# Patient Record
Sex: Male | Born: 1935 | Race: White | Hispanic: No | Marital: Married | Smoking: Former smoker
Health system: Southern US, Community
[De-identification: ages and names within clinical notes are randomized; demographics above are authoritative.]

## PROBLEM LIST (undated history)

## (undated) DIAGNOSIS — E785 Hyperlipidemia, unspecified: Secondary | ICD-10-CM

## (undated) DIAGNOSIS — R739 Hyperglycemia, unspecified: Secondary | ICD-10-CM

## (undated) DIAGNOSIS — I219 Acute myocardial infarction, unspecified: Secondary | ICD-10-CM

## (undated) DIAGNOSIS — I509 Heart failure, unspecified: Secondary | ICD-10-CM

## (undated) DIAGNOSIS — K219 Gastro-esophageal reflux disease without esophagitis: Secondary | ICD-10-CM

## (undated) DIAGNOSIS — I10 Essential (primary) hypertension: Secondary | ICD-10-CM

## (undated) DIAGNOSIS — K635 Polyp of colon: Secondary | ICD-10-CM

## (undated) DIAGNOSIS — G629 Polyneuropathy, unspecified: Secondary | ICD-10-CM

## (undated) DIAGNOSIS — M1990 Unspecified osteoarthritis, unspecified site: Secondary | ICD-10-CM

## (undated) DIAGNOSIS — D699 Hemorrhagic condition, unspecified: Secondary | ICD-10-CM

## (undated) DIAGNOSIS — E213 Hyperparathyroidism, unspecified: Secondary | ICD-10-CM

## (undated) DIAGNOSIS — I4891 Unspecified atrial fibrillation: Secondary | ICD-10-CM

## (undated) DIAGNOSIS — I2510 Atherosclerotic heart disease of native coronary artery without angina pectoris: Secondary | ICD-10-CM

## (undated) DIAGNOSIS — I739 Peripheral vascular disease, unspecified: Secondary | ICD-10-CM

## (undated) DIAGNOSIS — J189 Pneumonia, unspecified organism: Secondary | ICD-10-CM

## (undated) DIAGNOSIS — I519 Heart disease, unspecified: Secondary | ICD-10-CM

## (undated) DIAGNOSIS — J449 Chronic obstructive pulmonary disease, unspecified: Secondary | ICD-10-CM

## (undated) DIAGNOSIS — I499 Cardiac arrhythmia, unspecified: Secondary | ICD-10-CM

## (undated) DIAGNOSIS — J45909 Unspecified asthma, uncomplicated: Secondary | ICD-10-CM

## (undated) DIAGNOSIS — R972 Elevated prostate specific antigen [PSA]: Secondary | ICD-10-CM

## (undated) DIAGNOSIS — R011 Cardiac murmur, unspecified: Secondary | ICD-10-CM

## (undated) HISTORY — DX: Acute myocardial infarction, unspecified: I21.9

## (undated) HISTORY — DX: Hemorrhagic condition, unspecified: D69.9

## (undated) HISTORY — DX: Cardiac murmur, unspecified: R01.1

## (undated) HISTORY — DX: Unspecified osteoarthritis, unspecified site: M19.90

## (undated) HISTORY — PX: CARDIAC SURGERY: SHX584

## (undated) HISTORY — PX: TRANSURETHRAL RESECTION OF PROSTATE: SHX73

## (undated) HISTORY — DX: Unspecified atrial fibrillation: I48.91

## (undated) HISTORY — PX: HERNIA REPAIR: SHX51

## (undated) HISTORY — DX: Gastro-esophageal reflux disease without esophagitis: K21.9

## (undated) HISTORY — DX: Heart disease, unspecified: I51.9

## (undated) HISTORY — DX: Heart failure, unspecified: I50.9

## (undated) HISTORY — PX: PARATHYROIDECTOMY: SHX19

## (undated) HISTORY — PX: CARDIAC VALVE SURGERY: SHX40

---

## 2005-06-09 ENCOUNTER — Ambulatory Visit: Payer: Self-pay | Admitting: Gastroenterology

## 2007-02-14 ENCOUNTER — Ambulatory Visit: Payer: Self-pay | Admitting: Urology

## 2007-02-14 IMAGING — CT CT ABDOMEN AND PELVIS WITHOUT AND WITH CONTRAST
2 of 4 series · 14 of 32 positions shown, 19 images · IV contrast (agent unspecified)
Comparison: none

REASON FOR EXAM: hematuria
COMMENTS:

PROCEDURE:     CT  - CT ABDOMEN / PELVIS  W/WO  - [DATE]  [DATE]
RESULT:
TECHNIQUQE: Axial images were obtained from the hemidiaphragm to the pubic
symphysis pre and post intravenous injection of contrast material.
Nephrographic and excretory phases are obtained post contrast injection.

[Series 2: soft tissue w/o · axial · non-contrast · 0.78mm/px · z∈[-446,-54]mm · 8 of 63 slices shown, 13 images]
[im 7/63  soft-tissue]
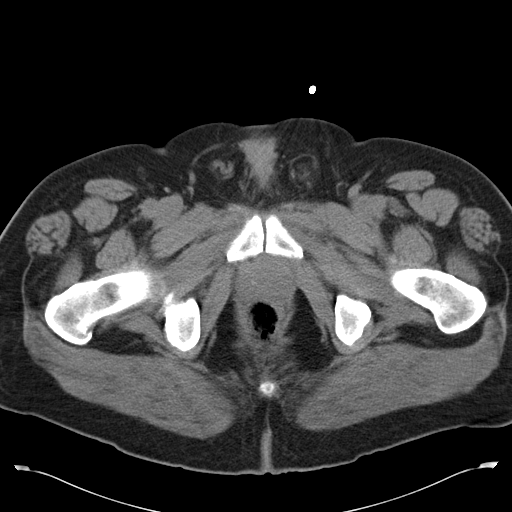
[im 7/63  bone]
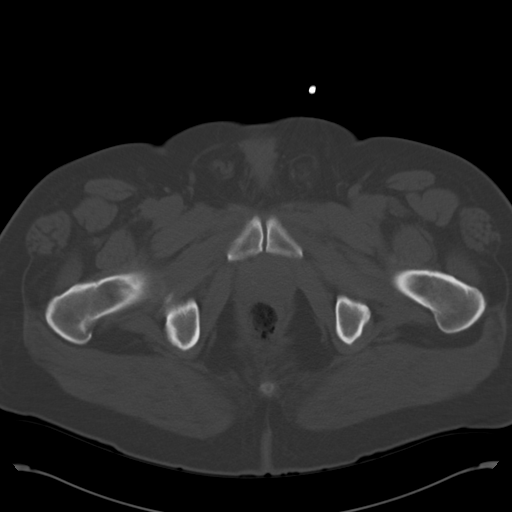
[im 14/63  soft-tissue]
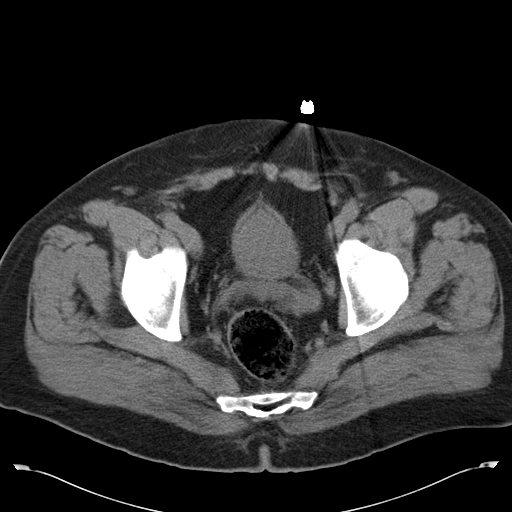
[im 21/63  soft-tissue]
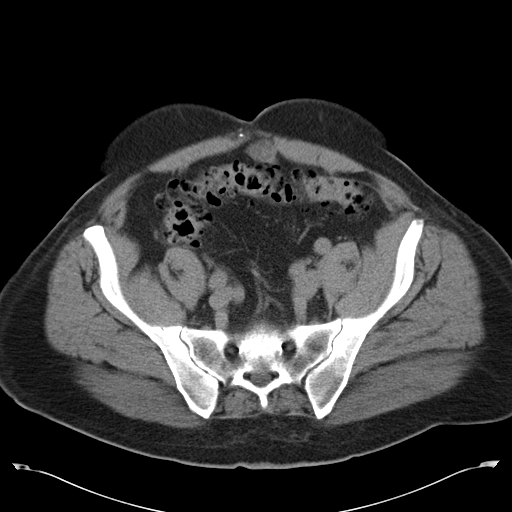
[im 28/63  soft-tissue]
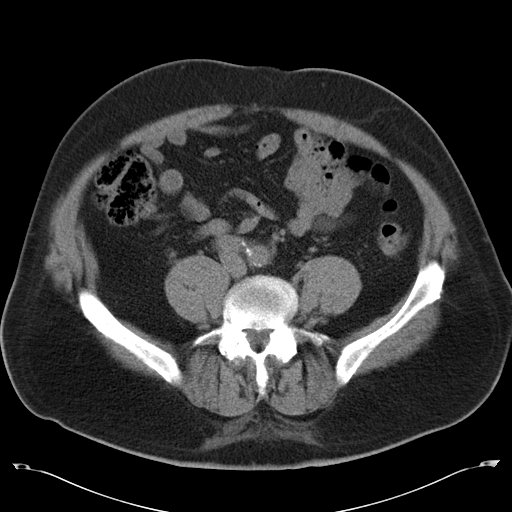
[im 35/63  soft-tissue]
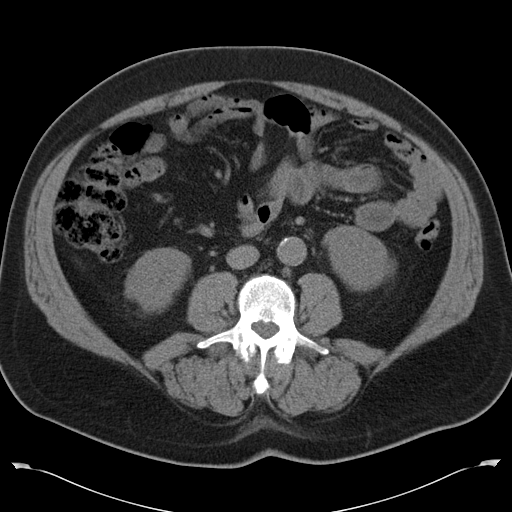
[im 35/63  lung]
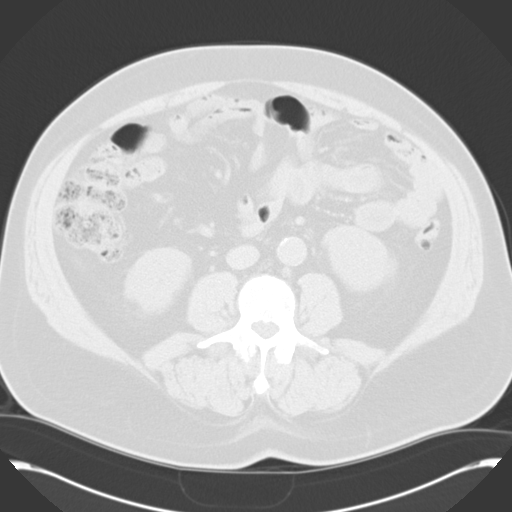
[im 42/63  soft-tissue]
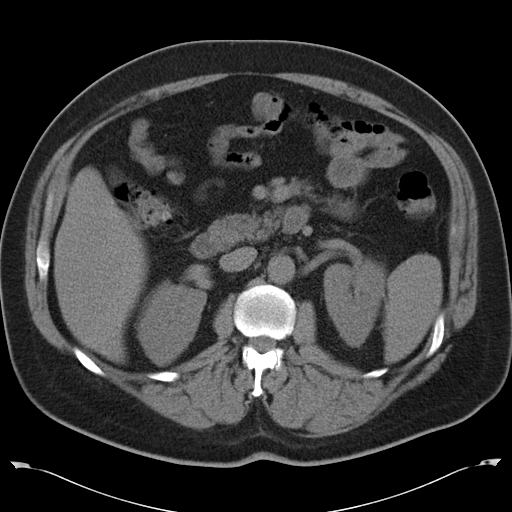
[im 42/63  lung]
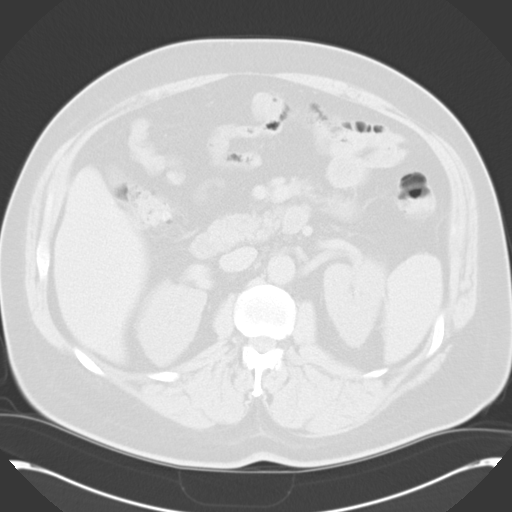
[im 49/63  soft-tissue]
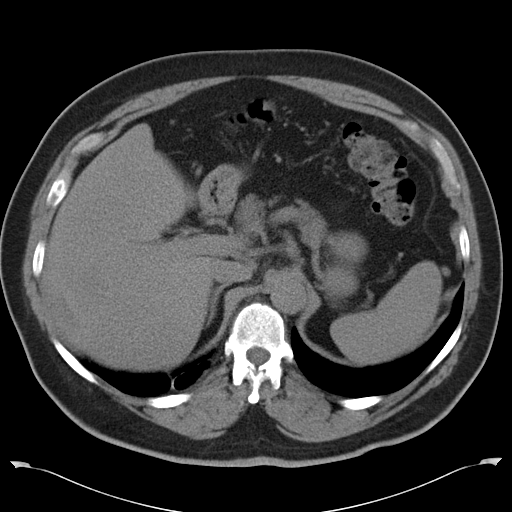
[im 49/63  lung]
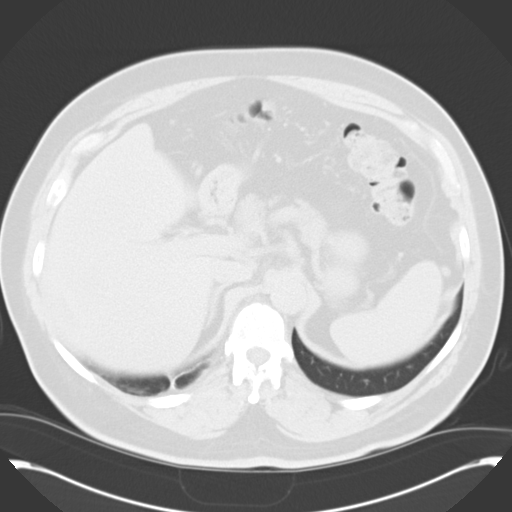
[im 56/63  soft-tissue]
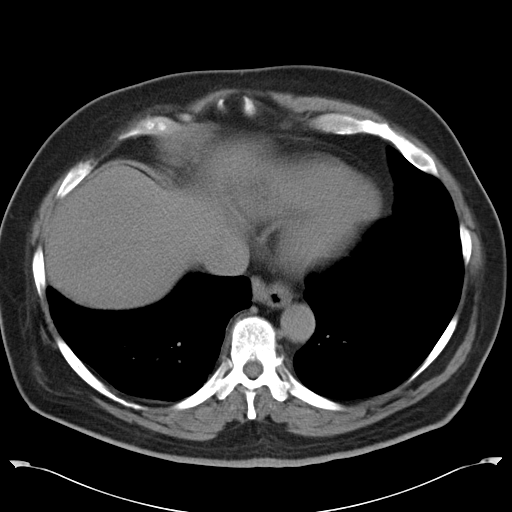
[im 56/63  lung]
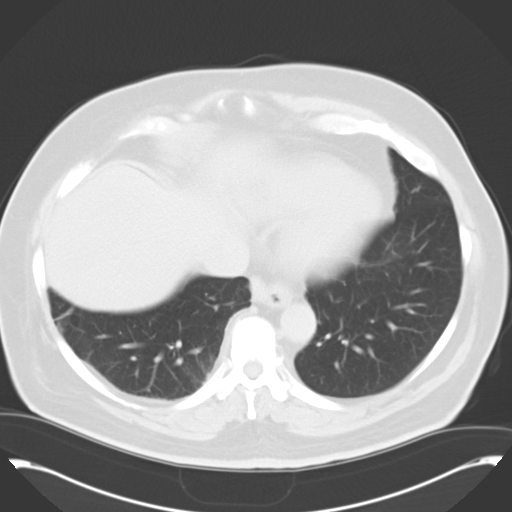

[Series 4: soft tissue with · axial · 0.78mm/px · z∈[-438,-126]mm · 6 of 63 slices shown]
[im 8/63  soft-tissue]
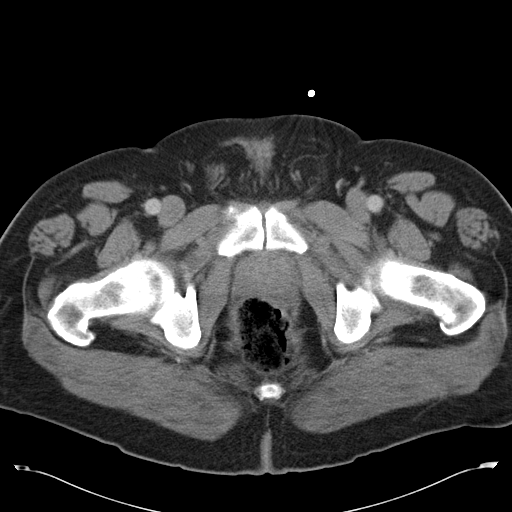
[im 16/63  soft-tissue]
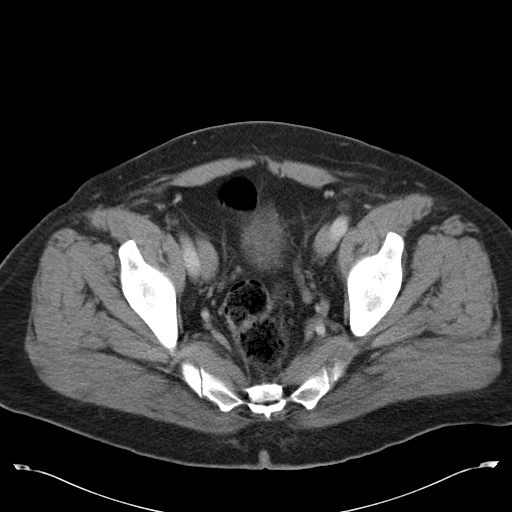
[im 24/63  soft-tissue]
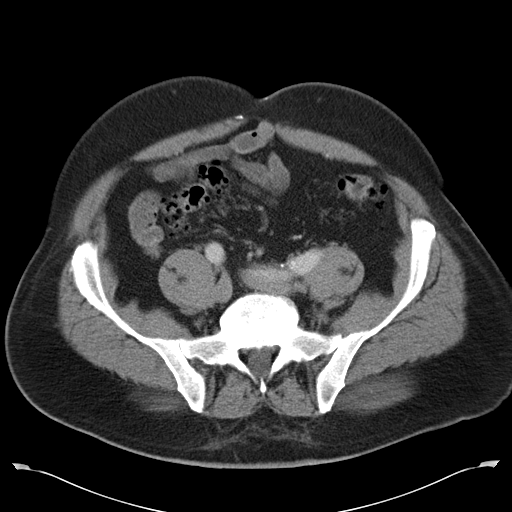
[im 32/63  soft-tissue]
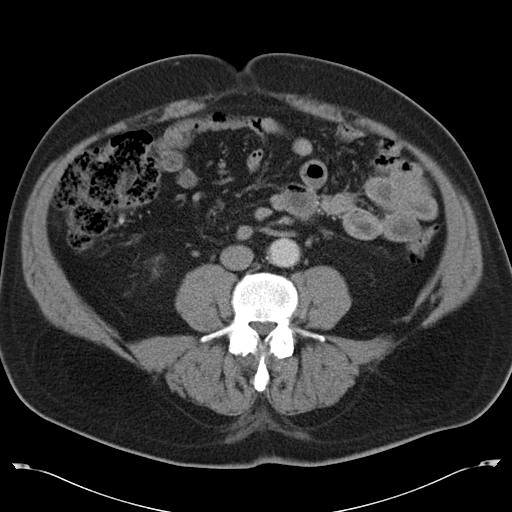
[im 39/63  soft-tissue]
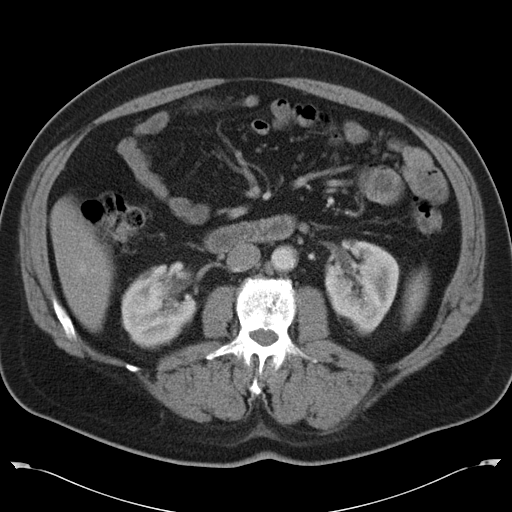
[im 47/63  soft-tissue]
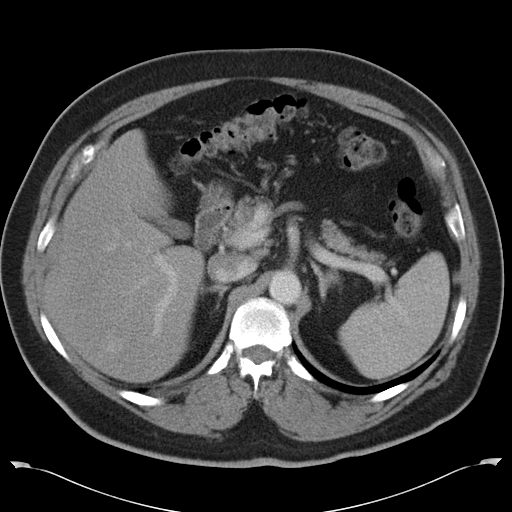

[14 of 32 positions shown; findings below may reference images not displayed]

FINDINGS: On the unenhanced images a tiny 1 mm calculus is noted in the
lower pole of the LEFT kidney on image #25. No obvious ureteral calculi are
seen. Post intravenous injection of contrast material both kidneys are noted
to excrete the contrast. No obvious mass is noted in the urinary bladder.
There is some impression on the floor of the urinary bladder more on the
LEFT than the RIGHT presumably from prostatic hypertrophy. There is noted
sigmoid diverticulosis without diverticulitis.

No major organ abnormality is seen. No obvious appendicitis is noted. No
effusions. The lung bases appear clear.
IMPRESSION: 1)1     mm calculus in the lower pole of the LEFT kidney which is
non-obstructing. No ureteral calculi. There is impression on the floor of
the urinary bladder asymmetrically on the LEFT, which may be secondary to
prostatic hypertrophy.

2)Sigmoid diverticulosis without diverticulitis.

## 2009-09-07 HISTORY — PX: AORTIC VALVE REPLACEMENT (AVR)/CORONARY ARTERY BYPASS GRAFTING (CABG): SHX5725

## 2009-12-10 ENCOUNTER — Ambulatory Visit: Payer: Self-pay | Admitting: Urology

## 2009-12-15 ENCOUNTER — Inpatient Hospital Stay: Payer: Self-pay | Admitting: Internal Medicine

## 2009-12-15 IMAGING — CR DG CHEST 1V PORT
1 series · 1 of 1 positions shown · non-contrast
Comparison: none

REASON FOR EXAM: cp
COMMENTS:

PROCEDURE:     DXR - DXR PORTABLE CHEST SINGLE VIEW  - [DATE]  [DATE]
RESULT:     The lungs are adequately inflated. The interstitial markings are
increased. The cardiac silhouette is normal in size. The pulmonary
vascularity is indistinct.

[view not recorded]
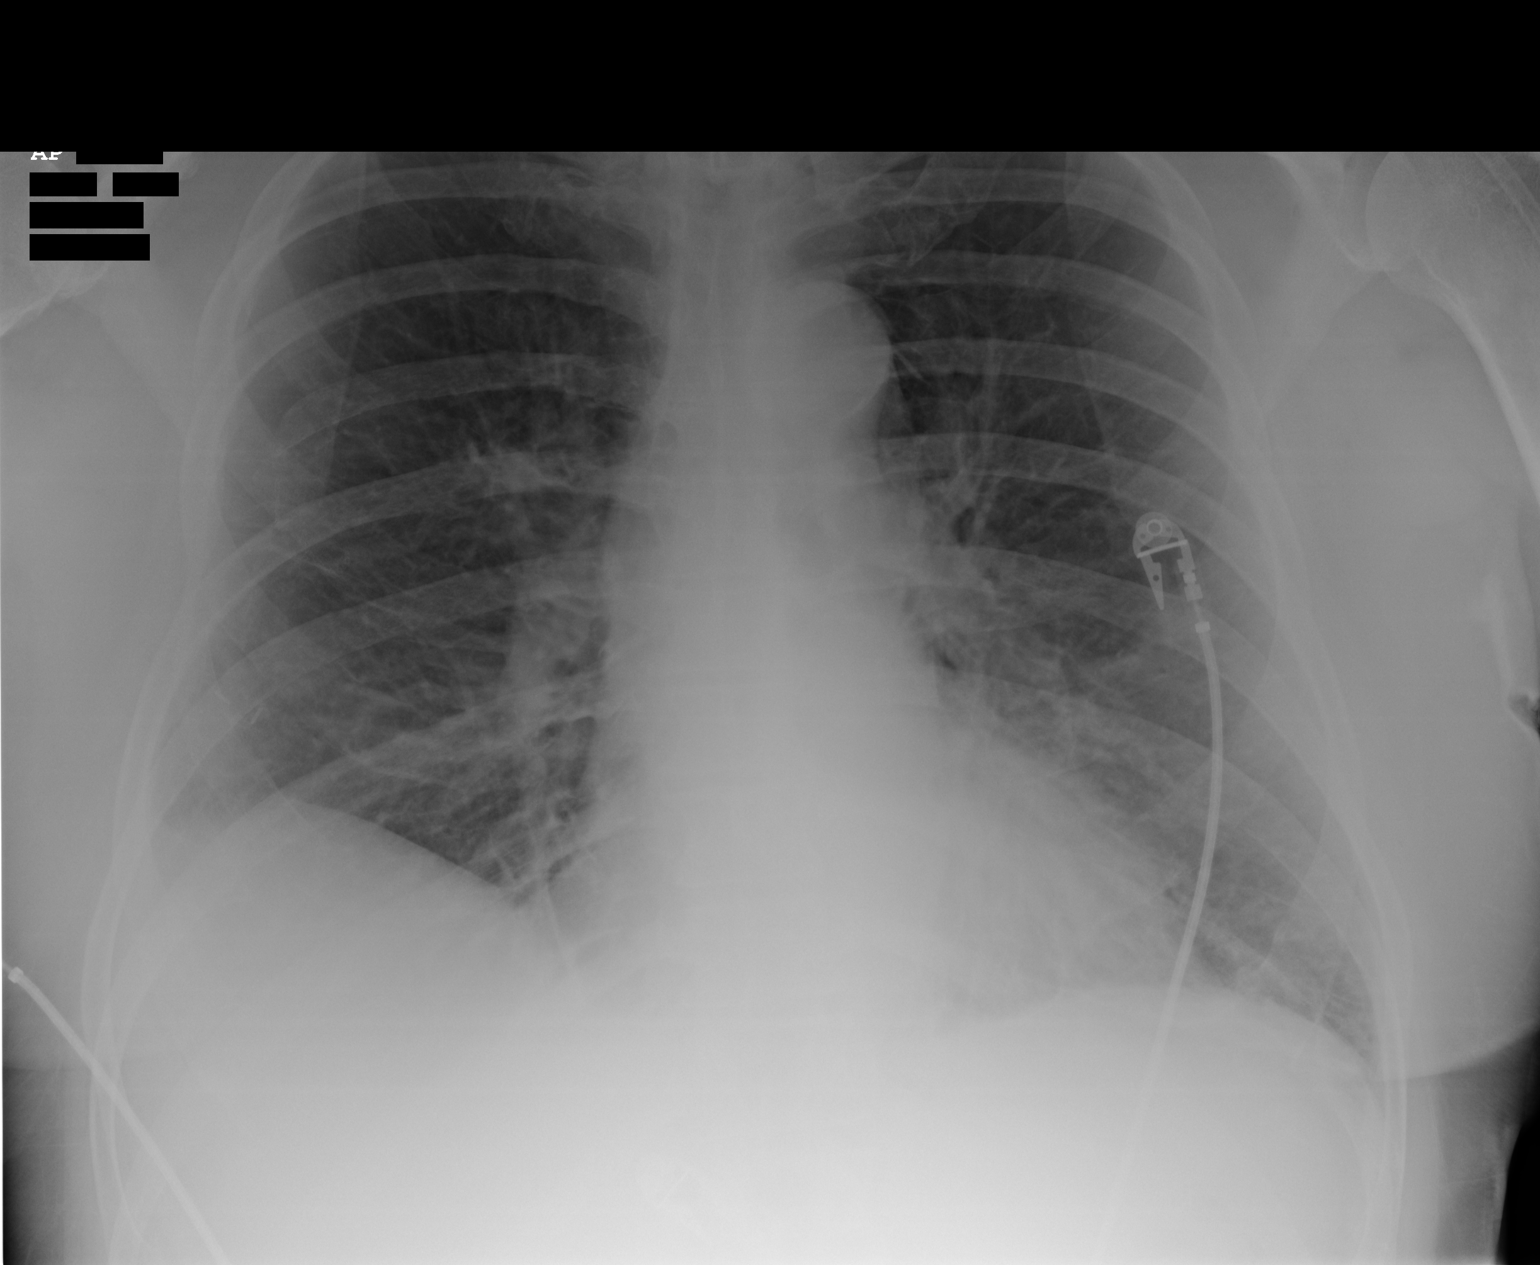

[1 of 1 positions shown; findings below may reference images not displayed]

IMPRESSION: The findings suggest low-grade interstitial edema possibly
secondary to CHF. There is no focal pneumonia. A followup PA and lateral
chest x-ray would be of value.

## 2009-12-18 ENCOUNTER — Ambulatory Visit: Payer: Self-pay | Admitting: Cardiology

## 2010-01-27 ENCOUNTER — Encounter: Payer: Self-pay | Admitting: Internal Medicine

## 2010-01-31 ENCOUNTER — Ambulatory Visit: Payer: Self-pay | Admitting: Internal Medicine

## 2010-02-06 ENCOUNTER — Encounter: Payer: Self-pay | Admitting: Internal Medicine

## 2010-02-13 ENCOUNTER — Inpatient Hospital Stay: Payer: Self-pay | Admitting: Specialist

## 2010-02-13 IMAGING — CR DG CHEST 1V PORT
1 series · 1 of 1 positions shown · non-contrast
Comparison: none

REASON FOR EXAM: CP
COMMENTS:

[view not recorded]
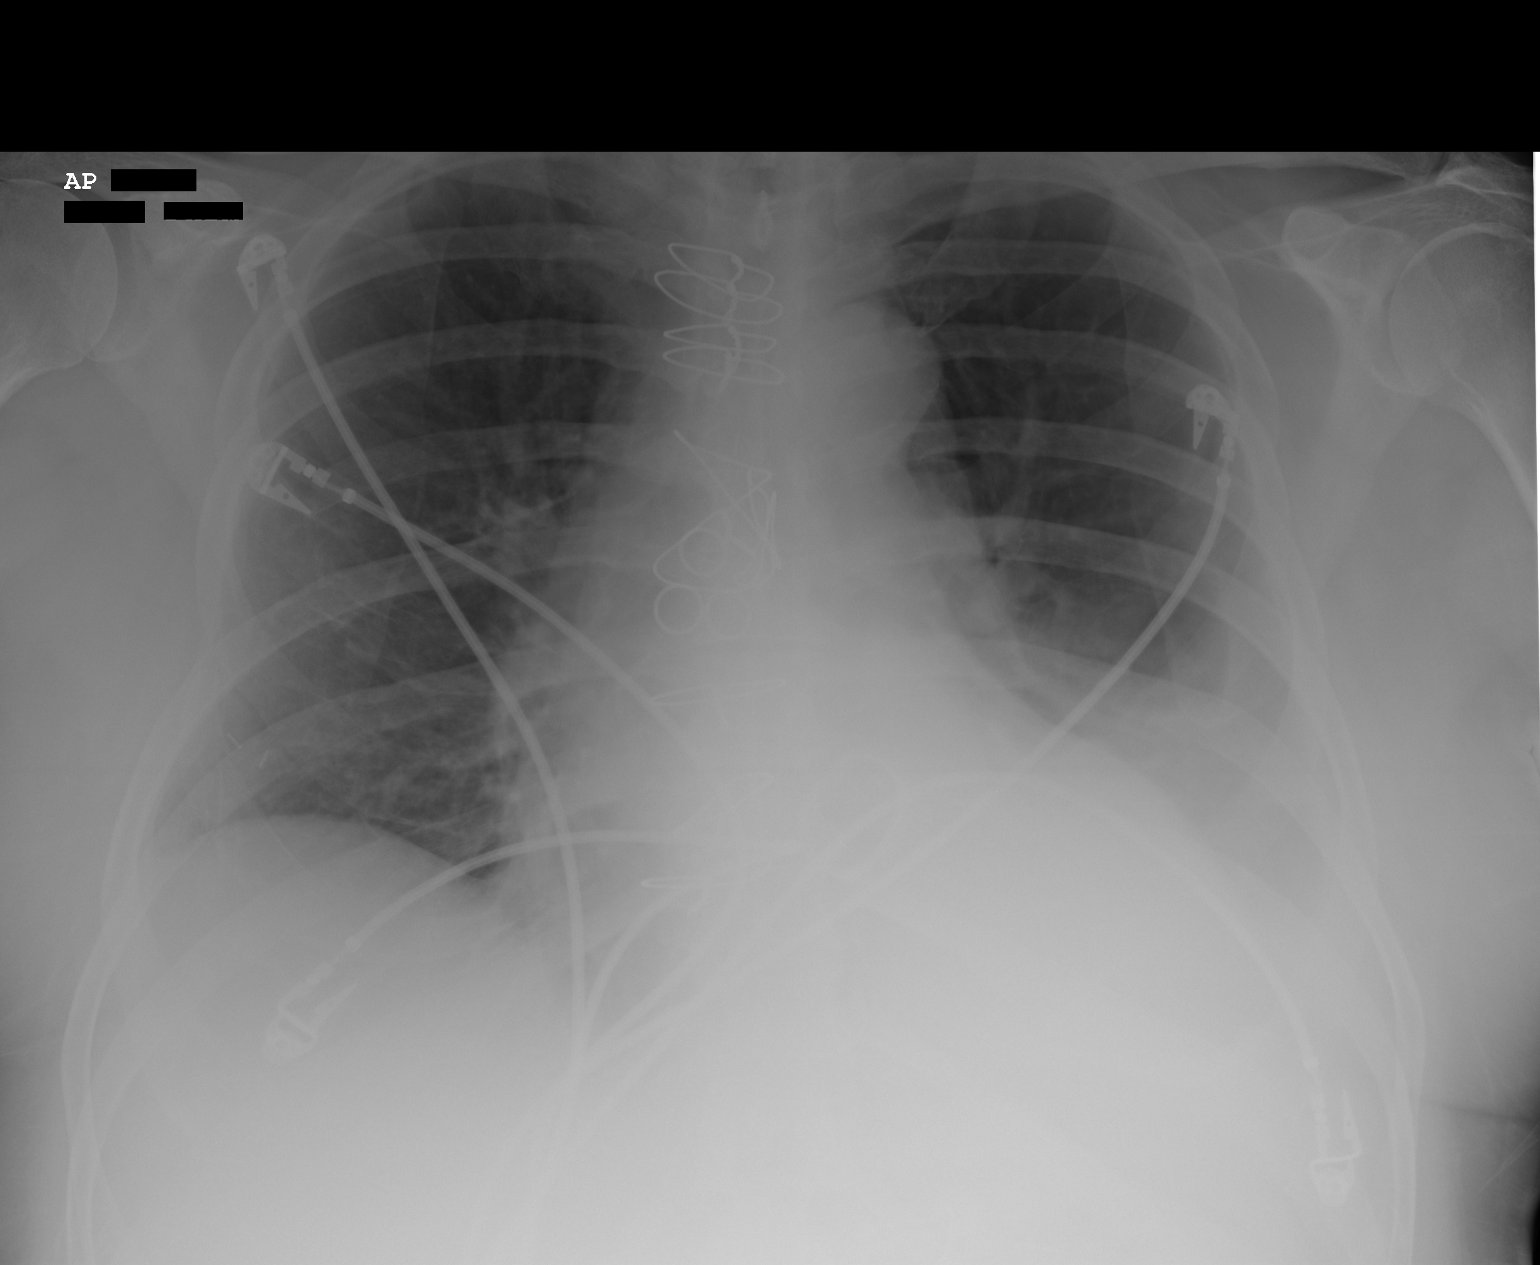

[1 of 1 positions shown; findings below may reference images not displayed]

PROCEDURE:     DXR - DXR PORTABLE CHEST SINGLE VIEW  - [DATE]  [DATE]

RESULT:     Comparison is made to the study [DATE].

The left hemidiaphragm is obscured. The cardiac silhouette is mildly
enlarged. The pulmonary vascularity is mildly prominent centrally. There is
no pleural effusion on the right than on the left I suspect that there is
pleural fluid.
IMPRESSION: The findings likely reflect low-grade CHF. A small left
pleural effusion is suspected. Followup PA and lateral chest films would be
of value.

## 2010-03-07 ENCOUNTER — Encounter: Payer: Self-pay | Admitting: Internal Medicine

## 2010-03-20 ENCOUNTER — Encounter: Payer: Self-pay | Admitting: Internal Medicine

## 2010-04-07 ENCOUNTER — Encounter: Payer: Self-pay | Admitting: Internal Medicine

## 2010-05-05 ENCOUNTER — Ambulatory Visit: Payer: Self-pay | Admitting: Internal Medicine

## 2010-05-08 ENCOUNTER — Encounter: Payer: Self-pay | Admitting: Internal Medicine

## 2010-05-27 ENCOUNTER — Observation Stay: Payer: Self-pay | Admitting: Specialist

## 2010-05-27 IMAGING — CR DG CHEST 1V PORT
1 series · 1 of 1 positions shown · non-contrast
Comparison: none

REASON FOR EXAM: chest pain
COMMENTS:

[view not recorded]
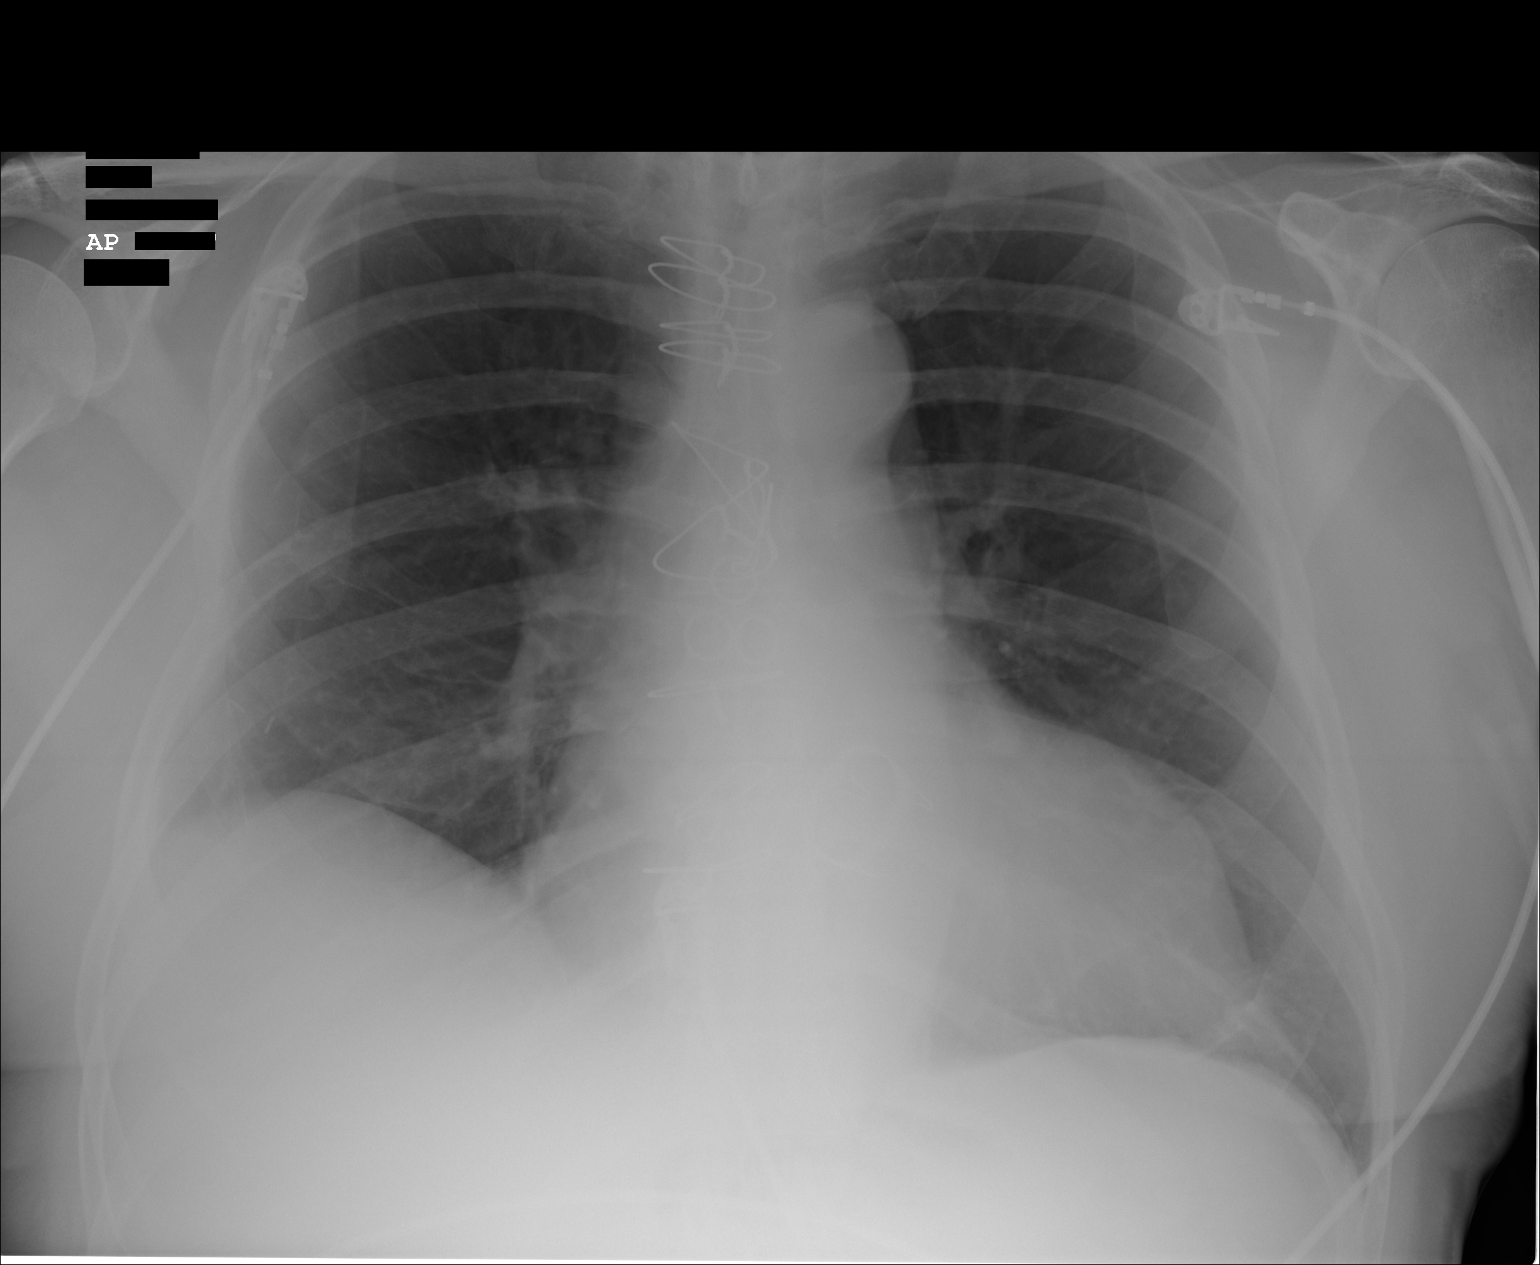

[1 of 1 positions shown; findings below may reference images not displayed]

PROCEDURE:     DXR - DXR PORTABLE CHEST SINGLE VIEW  - [DATE]  [DATE]

RESULT:     Comparison is made to a study dated [DATE].

The right hemidiaphragm is mildly elevated. The left lung base has cleared.
The cardiac silhouette is enlarged. The patient has undergone prior CABG.
The pulmonary vascularity is not engorged. The interstitial markings are
minimally prominent.
IMPRESSION: I do not see evidence of CHF nor of pneumonia. The right
hemidiaphragm is mildly elevated which is not a new finding.

## 2010-06-07 ENCOUNTER — Encounter: Payer: Self-pay | Admitting: Internal Medicine

## 2010-07-08 ENCOUNTER — Encounter: Payer: Self-pay | Admitting: Internal Medicine

## 2010-08-07 ENCOUNTER — Encounter: Payer: Self-pay | Admitting: Internal Medicine

## 2010-09-07 ENCOUNTER — Encounter: Payer: Self-pay | Admitting: Internal Medicine

## 2010-10-08 ENCOUNTER — Encounter: Payer: Self-pay | Admitting: Internal Medicine

## 2012-07-05 DIAGNOSIS — R3129 Other microscopic hematuria: Secondary | ICD-10-CM | POA: Insufficient documentation

## 2012-07-05 DIAGNOSIS — N401 Enlarged prostate with lower urinary tract symptoms: Secondary | ICD-10-CM | POA: Insufficient documentation

## 2012-07-05 DIAGNOSIS — R972 Elevated prostate specific antigen [PSA]: Secondary | ICD-10-CM | POA: Insufficient documentation

## 2014-06-27 DIAGNOSIS — I5022 Chronic systolic (congestive) heart failure: Secondary | ICD-10-CM | POA: Insufficient documentation

## 2014-06-27 DIAGNOSIS — E782 Mixed hyperlipidemia: Secondary | ICD-10-CM | POA: Insufficient documentation

## 2014-06-27 DIAGNOSIS — I340 Nonrheumatic mitral (valve) insufficiency: Secondary | ICD-10-CM | POA: Insufficient documentation

## 2014-06-27 DIAGNOSIS — I6523 Occlusion and stenosis of bilateral carotid arteries: Secondary | ICD-10-CM | POA: Insufficient documentation

## 2014-06-27 DIAGNOSIS — I480 Paroxysmal atrial fibrillation: Secondary | ICD-10-CM | POA: Insufficient documentation

## 2014-06-27 DIAGNOSIS — I10 Essential (primary) hypertension: Secondary | ICD-10-CM | POA: Insufficient documentation

## 2014-10-17 DIAGNOSIS — I2510 Atherosclerotic heart disease of native coronary artery without angina pectoris: Secondary | ICD-10-CM | POA: Insufficient documentation

## 2016-12-02 DIAGNOSIS — I4892 Unspecified atrial flutter: Secondary | ICD-10-CM | POA: Insufficient documentation

## 2017-10-13 DIAGNOSIS — R0602 Shortness of breath: Secondary | ICD-10-CM | POA: Insufficient documentation

## 2017-12-02 DIAGNOSIS — R42 Dizziness and giddiness: Secondary | ICD-10-CM | POA: Insufficient documentation

## 2018-07-29 ENCOUNTER — Other Ambulatory Visit: Payer: Self-pay

## 2018-07-29 ENCOUNTER — Encounter: Payer: Self-pay | Admitting: Urology

## 2018-07-29 ENCOUNTER — Ambulatory Visit: Payer: Medicare Other | Admitting: Urology

## 2018-07-29 VITALS — BP 104/74 | HR 57 | Ht 73.0 in | Wt 215.6 lb

## 2018-07-29 DIAGNOSIS — N401 Enlarged prostate with lower urinary tract symptoms: Secondary | ICD-10-CM | POA: Diagnosis not present

## 2018-07-29 DIAGNOSIS — R972 Elevated prostate specific antigen [PSA]: Secondary | ICD-10-CM | POA: Diagnosis not present

## 2018-07-29 DIAGNOSIS — R310 Gross hematuria: Secondary | ICD-10-CM | POA: Diagnosis not present

## 2018-07-29 DIAGNOSIS — R3911 Hesitancy of micturition: Secondary | ICD-10-CM

## 2018-07-31 ENCOUNTER — Encounter: Payer: Self-pay | Admitting: Urology

## 2018-07-31 NOTE — Progress Notes (Signed)
07/29/2018 8:56 AM   Dwayne Robinson Jul 20, 1936 [id number]  Referring provider: Jaclyn Shaggy, MD [address]   [place], [place] [address]  Chief Complaint  Patient presents with  . Hematuria    HPI: 82 year old male seen at the request of Dr. Arlana Pouch for evaluation of gross hematuria.  Approximately 2 months ago he had an episode of total gross painless hematuria.  He states this was present for 1 void and cleared.  He denies use of anticoagulant medication.  He had a urinalysis at Dr. Audley Hose on 06/25/2018 which showed 10-15 RBCs.  He has a long history of BPH.  He was previously on finasteride but stopped this medication in 2016.  He has moderate lower urinary tract symptoms including urinary hesitancy and a prolonged voiding time however states his symptoms are not bothersome.  He has a history of an elevated PSA and underwent a prostate biopsy in 2000 for PSA of 5.4.  He also underwent a hematuria evaluation greater than 10 years ago which showed BPH.   PMH: Past Medical History:  Diagnosis Date  . A-fib (HCC)   . Arthritis   . Bleeding disorder (HCC)   . GERD (gastroesophageal reflux disease)   . Heart attack (HCC)   . Heart disease   . Heart failure (HCC)   . Heart murmur     Surgical History: Past Surgical History:  Procedure Laterality Date  . CARDIAC SURGERY    . CARDIAC VALVE SURGERY      Home Medications:  Allergies as of 07/29/2018      Reactions   Sulfa Antibiotics Hives      Medication List        Accurate as of 07/29/18 11:59 PM. Always use your most recent med list.          DULoxetine 60 MG capsule Commonly known as:  CYMBALTA Take by mouth.   metoprolol succinate 50 MG 24 hr tablet Commonly known as:  TOPROL-XL Take by mouth.   omeprazole 20 MG capsule Commonly known as:  PRILOSEC Take by mouth.   pravastatin 40 MG tablet Commonly known as:  PRAVACHOL Take by mouth.   spironolactone 25 MG tablet Commonly known as:   ALDACTONE Take by mouth.       Allergies:  Allergies  Allergen Reactions  . Sulfa Antibiotics Hives    Family History: Family History  Problem Relation Age of Onset  . Stroke Mother   . Heart Problems Father   . Prostate cancer Neg Hx   . Bladder Cancer Neg Hx   . Kidney cancer Neg Hx     Social History:  reports that he quit smoking about 30 years ago. His smoking use included cigarettes. He has never used smokeless tobacco. He reports that he drank alcohol. He reports that he does not use drugs.  ROS: UROLOGY Frequent Urination?: Yes Hard to postpone urination?: Yes Burning/pain with urination?: Yes Get up at night to urinate?: Yes Leakage of urine?: Yes Urine stream starts and stops?: Yes Trouble starting stream?: No Do you have to strain to urinate?: No Blood in urine?: Yes Urinary tract infection?: No Sexually transmitted disease?: No Injury to kidneys or bladder?: No Painful intercourse?: No Weak stream?: Yes Erection problems?: No Penile pain?: No  Gastrointestinal Nausea?: No Vomiting?: No Indigestion/heartburn?: Yes Diarrhea?: No Constipation?: Yes  Constitutional Fever: No Night sweats?: No Weight loss?: No Fatigue?: Yes  Skin Skin rash/lesions?: Yes Itching?: Yes  Eyes Blurred vision?: Yes Double  vision?: No  Ears/Nose/Throat Sore throat?: No Sinus problems?: No  Hematologic/Lymphatic Swollen glands?: No Easy bruising?: Yes  Cardiovascular Leg swelling?: No Chest pain?: No  Respiratory Cough?: No Shortness of breath?: Yes  Endocrine Excessive thirst?: No  Musculoskeletal Back pain?: Yes Joint pain?: Yes  Neurological Headaches?: No Dizziness?: Yes  Psychologic Depression?: No Anxiety?: No  Physical Exam: BP 104/74   Pulse (!) 57   Ht 6\' 1"  (1.854 m)   Wt 215 lb 9.6 oz (97.8 kg)   BMI 28.44 kg/m   Constitutional:  Alert and oriented, No acute distress. HEENT: [place] AT, moist mucus membranes.  Trachea  midline, no masses. Cardiovascular: No clubbing, cyanosis, or edema. Respiratory: Normal respiratory effort, no increased work of breathing. GI: Abdomen is soft, nontender, nondistended, no abdominal masses GU: No CVA tenderness.  Prostate 50 g, smooth without nodules Lymph: No cervical or inguinal lymphadenopathy. Skin: No rashes, bruises or suspicious lesions. Neurologic: Grossly intact, no focal deficits, moving all 4 extremities. Psychiatric: Normal mood and affect.  Assessment & Plan:   82 year old male with an isolated episode of total gross painless hematuria without recurrence.  I recommended a repeat hematuria evaluation to include CT urogram and cystoscopy.  He had a negative prostate biopsy in 2000 for a mildly elevated PSA and his PSA remains stable.  It was last checked in 2015.  He does not desire to have checked and would not recommend continued monitoring based on his age.   Riki AltesScott C Draden Cottingham, MD  [facility] [address], [address] [place], [place] [address] [phone]

## 2018-08-08 DIAGNOSIS — I119 Hypertensive heart disease without heart failure: Secondary | ICD-10-CM | POA: Insufficient documentation

## 2018-09-21 ENCOUNTER — Ambulatory Visit
Admission: RE | Admit: 2018-09-21 | Discharge: 2018-09-21 | Disposition: A | Discharge status: Home / Self Care | Payer: Medicare Other | Source: Ambulatory Visit | Attending: Urology | Admitting: Urology

## 2018-09-21 DIAGNOSIS — R310 Gross hematuria: Secondary | ICD-10-CM | POA: Insufficient documentation

## 2018-09-21 LAB — POCT I-STAT CREATININE: Creatinine, Ser: 1.1 mg/dL (ref 0.61–1.24)

## 2018-09-21 IMAGING — CT CT ABD-PEL WO/W CM
2 of 12 series · 8 of 46 positions shown, 14 images · IV contrast (iopamidol)
Comparison: CT the abdomen and pelvis [DATE].

CLINICAL DATA: 82-year-old male with history of intermittent
painless gross hematuria and frequent urination with nocturia.
History of ruptured bowel status post surgical repair as a child.

EXAM:
CT ABDOMEN AND PELVIS WITHOUT AND WITH CONTRAST
TECHNIQUE: Multidetector CT imaging of the abdomen and pelvis was performed
following the standard protocol before and following the bolus
administration of intravenous contrast.
CONTRAST:  125mL [EK] IOPAMIDOL ([EK]) INJECTION 61%

[Series 5: cor without without pre · coronal · non-contrast · 0.76mm/px · 2 of 149 slices shown, 3 images]
[im 50/149  soft-tissue]
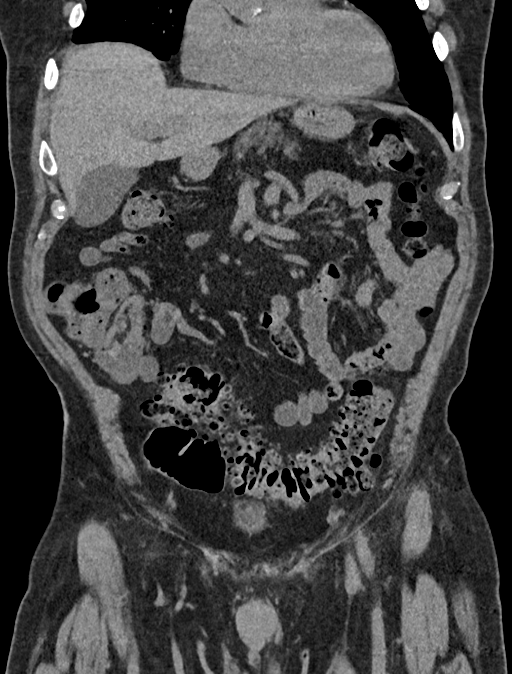
[im 50/149  bone]
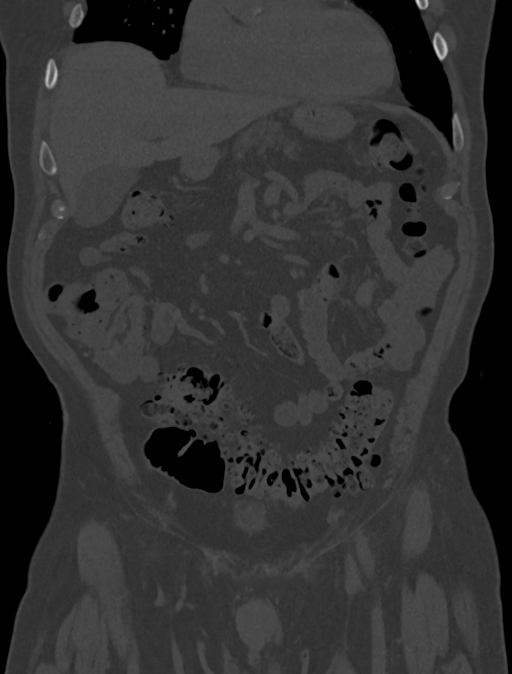
[im 99/149  soft-tissue]
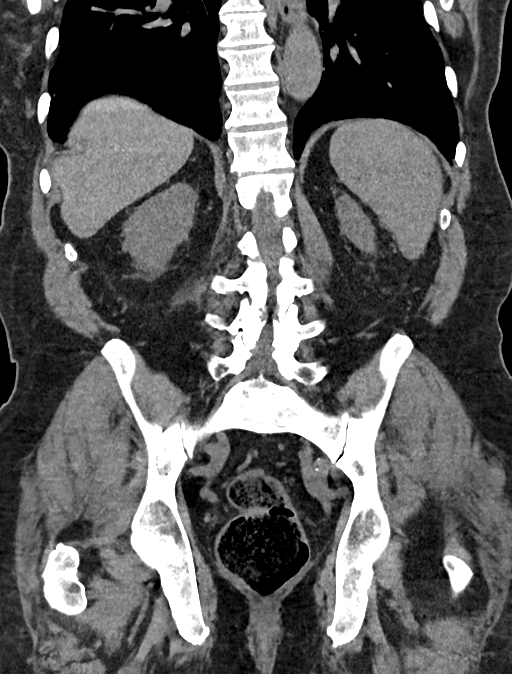

[Series 9: axial with hematuria with · axial · 0.76mm/px · z∈[-1550,-1190]mm · 6 of 102 slices shown, 11 images]
[im 15/102  soft-tissue]
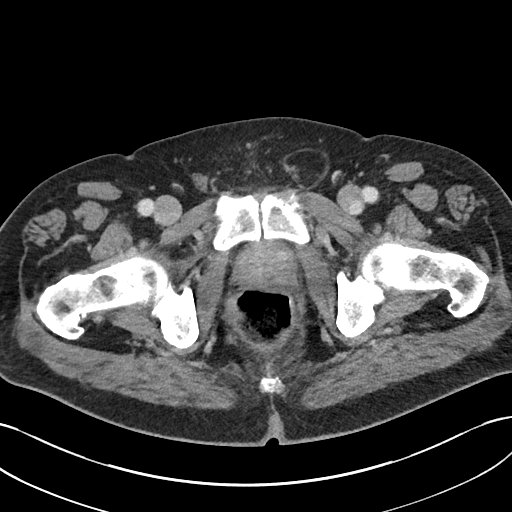
[im 15/102  bone]
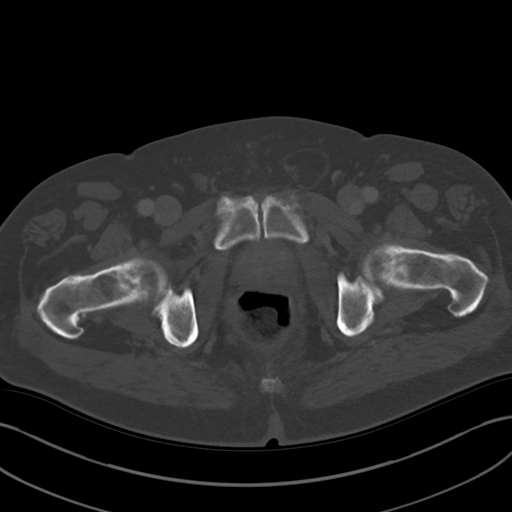
[im 29/102  soft-tissue]
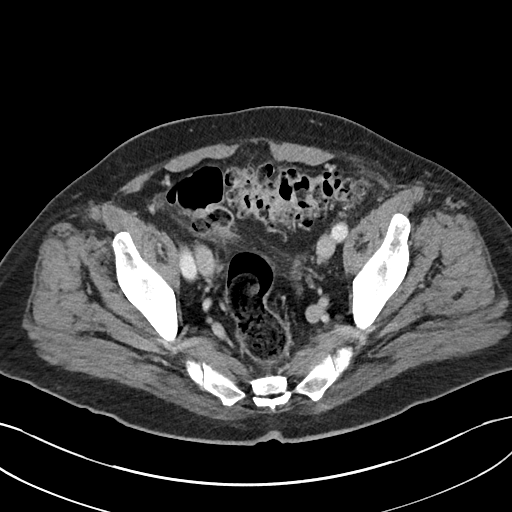
[im 44/102  soft-tissue]
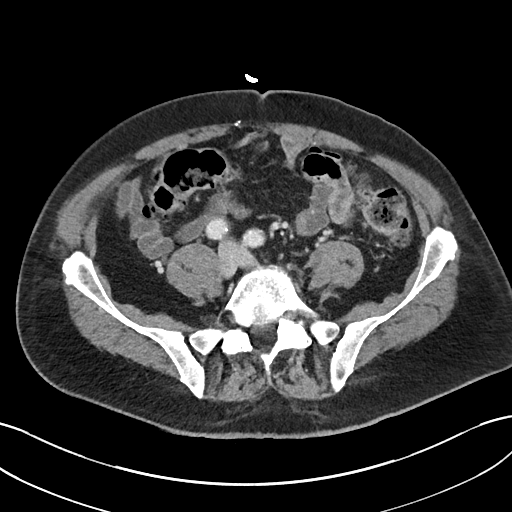
[im 44/102  lung]
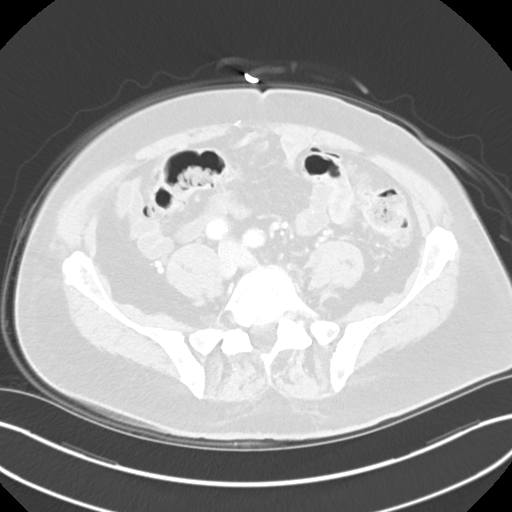
[im 58/102  soft-tissue]
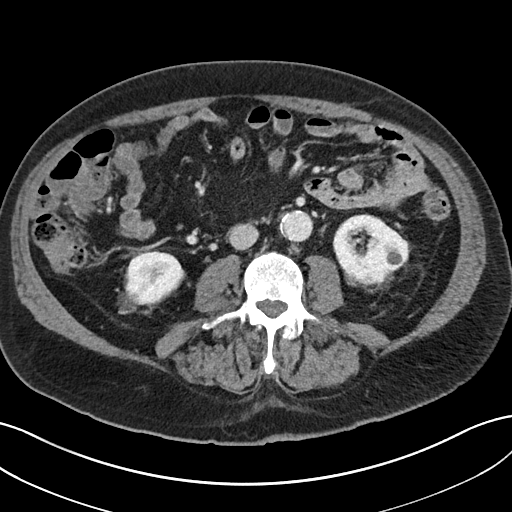
[im 58/102  lung]
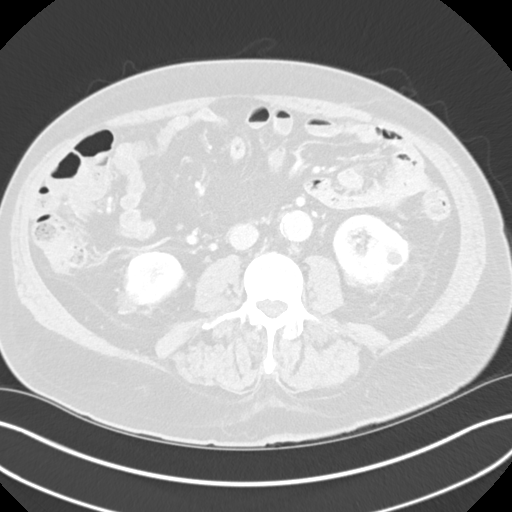
[im 73/102  soft-tissue]
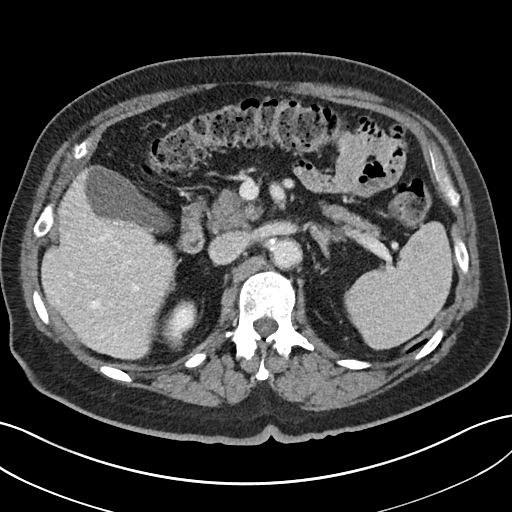
[im 73/102  lung]
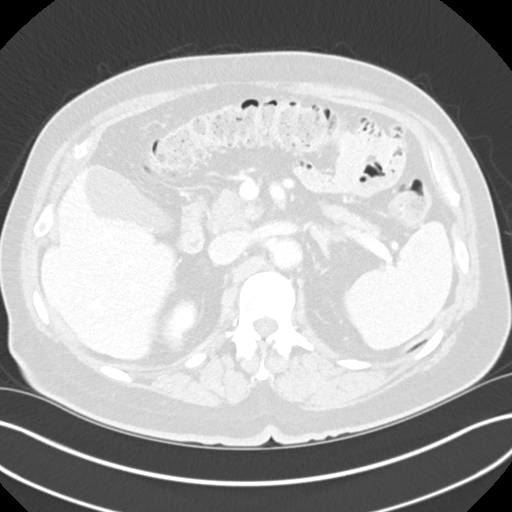
[im 87/102  soft-tissue]
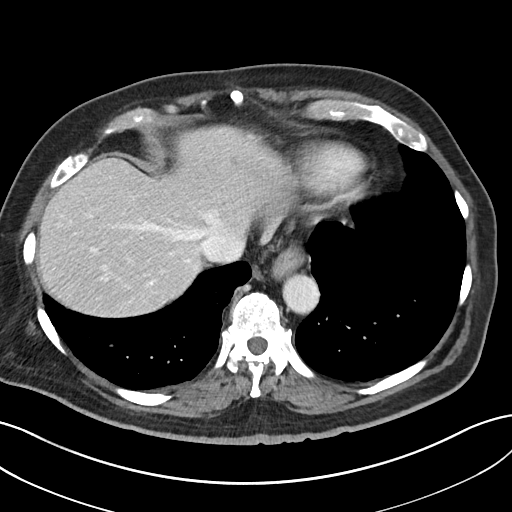
[im 87/102  lung]
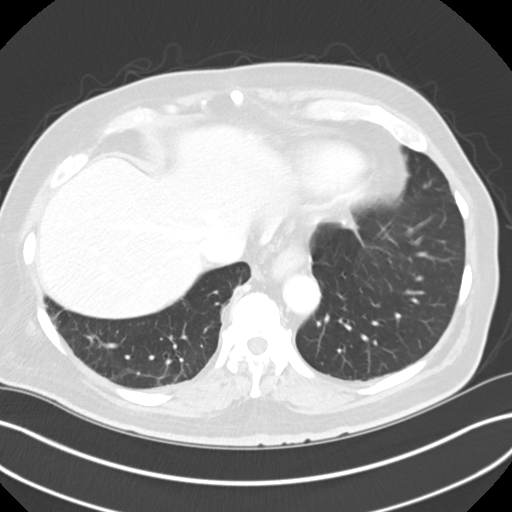

[8 of 46 positions shown; findings below may reference images not displayed]

FINDINGS: Lower chest: Scarring throughout the lung bases bilaterally. Status
post median sternotomy for mitral valve replacement with what
appears to be a stented bioprosthesis. Atherosclerotic
calcifications in the left anterior descending, left circumflex and
right coronary arteries.

Hepatobiliary: No suspicious cystic or solid hepatic lesions. No
intra or extrahepatic biliary ductal dilatation. Multiple tiny
partially calcified gallstones lying dependently in the gallbladder.
No evidence to suggest an acute cholecystitis at this time.

Pancreas: No pancreatic mass. No pancreatic ductal dilatation. No
pancreatic or peripancreatic fluid or inflammatory changes.

Spleen: Unremarkable.

Adrenals/Urinary Tract: Multiple small nonobstructive calculi are
noted within the collecting systems of both kidneys, largest of
which is in the interpolar region of the left kidney measuring 3 mm.
No additional calculi are noted along the course of either ureter or
within the lumen of the urinary bladder. In the posterior aspect of
the lower pole of the left kidney (axial image 44 of series 9) there
is a 1.5 cm enhancing lesion which measures 39 HU on precontrast
images, 134 HU on post-contrast images with some washout to 73 HU on
postcontrast delayed images. Intermediate attenuation nonenhancing
lesion in the posterior aspect of the lower pole the left kidney
compatible with a proteinaceous/hemorrhagic cyst measuring 1.3 cm
(axial image 46 of series 9). Other low-attenuation lesions in both
kidneys, compatible with simple cysts measuring up to 2.3 cm in the
lower pole of the right kidney. Subcentimeter low-attenuation
lesions in both kidneys, too small to characterize, but favored to
represent tiny cysts. On postcontrast delayed images there are no
definite filling defects within the collecting system of either
kidney, along the course of either ureter, or within the lumen of
the urinary bladder to strongly suggest the presence of urothelial
neoplasm at this time. Urinary bladder is grossly unremarkable in
appearance.

Stomach/Bowel: Normal appearance of the stomach. No pathologic
dilatation of small bowel or colon. Numerous colonic diverticulae
are noted, particularly in the sigmoid colon, without surrounding
inflammatory changes to suggest an acute diverticulitis at this
time. The appendix is not confidently identified and may be
surgically absent. Regardless, there are no inflammatory changes
noted adjacent to the cecum to suggest the presence of an acute
appendicitis at this time.

Vascular/Lymphatic: Aortic atherosclerosis, without evidence of
aneurysm or dissection in the abdominal or pelvic vasculature. No
lymphadenopathy noted in the abdomen or pelvis.

Reproductive: Severe median lobe hypertrophy in the prostate gland.
Status post TURP. Seminal vesicles are unremarkable in appearance.

Other: No significant volume of ascites.  No pneumoperitoneum.

Musculoskeletal: There are no aggressive appearing lytic or blastic
lesions noted in the visualized portions of the skeleton.
IMPRESSION: 1. Nonobstructive calculi in the collecting systems of both kidneys
measuring up to 3 mm in the left renal collecting system. No
ureteral stones or findings of urinary tract obstruction are noted
at this time.
2. Multiple lesions in both kidneys, most of which have benign
imaging characteristics. The exception is a 1.5 cm lesion in the
medial aspect of the lower pole of the left kidney which is
concerning for a small enhancing renal neoplasm. Further
characterization with nonemergent MRI of the abdomen with and
without IV gadolinium should be considered to better evaluate this
finding.
3. Cholelithiasis without evidence of acute cholecystitis.
4. Colonic diverticulosis without findings to suggest an acute
diverticulitis at this time.
5. Severe median lobe hypertrophy of the prostate gland in this
patient status post TURP.
6. Aortic atherosclerosis, in addition to at least 3 vessel coronary
artery disease.
7. Additional incidental findings, as above.

## 2018-09-21 MED ORDER — IOPAMIDOL (ISOVUE-300) INJECTION 61%
125.0000 mL | Freq: Once | INTRAVENOUS | Status: AC | PRN
  Administered 2018-09-21: 125 mL via INTRAVENOUS

## 2018-09-23 ENCOUNTER — Encounter: Payer: Self-pay | Admitting: Urology

## 2018-09-23 ENCOUNTER — Ambulatory Visit: Payer: Medicare Other | Admitting: Urology

## 2018-09-23 VITALS — BP 110/76 | HR 112 | Ht 73.0 in | Wt 216.2 lb

## 2018-09-23 DIAGNOSIS — N2889 Other specified disorders of kidney and ureter: Secondary | ICD-10-CM

## 2018-09-23 DIAGNOSIS — I252 Old myocardial infarction: Secondary | ICD-10-CM | POA: Insufficient documentation

## 2018-09-23 DIAGNOSIS — R310 Gross hematuria: Secondary | ICD-10-CM

## 2018-09-23 MED ORDER — LIDOCAINE HCL URETHRAL/MUCOSAL 2 % EX GEL: 1.0000 "application " | Freq: Once | CUTANEOUS | Status: AC

## 2018-09-23 NOTE — Progress Notes (Signed)
09/23/18  CC:  Chief Complaint  Patient presents with  . Cysto    gross hematuria    HPI: 83 year old male seen 07/29/2018 after an episode of total gross painless hematuria.  He has had 1 additional episode of passing a small clot.  Blood pressure 110/76, pulse (!) 112, height 6\' 1"  (1.854 m), weight 216 lb 3.2 oz (98.1 kg). NED. A&Ox3.   No respiratory distress   Abd soft, NT, ND Normal phallus with bilateral descended testicles  Cystoscopy Procedure Note  Patient identification was confirmed, informed consent was obtained, and patient was prepped using Betadine solution.  Lidocaine jelly was administered per urethral meatus.     Pre-Procedure: - Inspection reveals a normal caliber ureteral meatus.  Procedure: The flexible cystoscope was introduced without difficulty - No urethral strictures/lesions are present. - Lateral lobe enlargement with prominent hypervascularity prostate  - Moderate elevation bladder neck - Bilateral ureteral orifices identified - Bladder mucosa  reveals no ulcers, tumors, or lesions - No bladder stones -Moderate trabeculation  Retroflexion shows small intravesical median lobe   Post-Procedure: - Patient tolerated the procedure well  CT urogram:  CT ABDOMEN AND PELVIS WITHOUT AND WITH CONTRAST  TECHNIQUE: Multidetector CT imaging of the abdomen and pelvis was performed following the standard protocol before and following the bolus administration of intravenous contrast.  CONTRAST:  ISOVUE-300 IOPAMIDOL (ISOVUE-300) INJECTION 61%  COMPARISON:  CT the abdomen and pelvis 02/14/2007.  FINDINGS: Lower chest: Scarring throughout the lung bases bilaterally. Status post median sternotomy for mitral valve replacement with what appears to be a stented bioprosthesis. Atherosclerotic calcifications in the left anterior descending, left circumflex and right coronary arteries.  Hepatobiliary: No suspicious cystic or solid  hepatic lesions. No intra or extrahepatic biliary ductal dilatation. Multiple tiny partially calcified gallstones lying dependently in the gallbladder. No evidence to suggest an acute cholecystitis at this time.  Pancreas: No pancreatic mass. No pancreatic ductal dilatation. No pancreatic or peripancreatic fluid or inflammatory changes.  Spleen: Unremarkable.  Adrenals/Urinary Tract: Multiple small nonobstructive calculi are noted within the collecting systems of both kidneys, largest of which is in the interpolar region of the left kidney measuring 3 mm. No additional calculi are noted along the course of either ureter or within the lumen of the urinary bladder. In the posterior aspect of the lower pole of the left kidney (axial image 44 of series 9) there is a 1.5 cm enhancing lesion which measures 39 HU on precontrast images, 134 HU on post-contrast images with some washout to 73 HU on postcontrast delayed images. Intermediate attenuation nonenhancing lesion in the posterior aspect of the lower pole the left kidney compatible with a proteinaceous/hemorrhagic cyst measuring 1.3 cm (axial image 46 of series 9). Other low-attenuation lesions in both kidneys, compatible with simple cysts measuring up to 2.3 cm in the lower pole of the right kidney. Subcentimeter low-attenuation lesions in both kidneys, too small to characterize, but favored to represent tiny cysts. On postcontrast delayed images there are no definite filling defects within the collecting system of either kidney, along the course of either ureter, or within the lumen of the urinary bladder to strongly suggest the presence of urothelial neoplasm at this time. Urinary bladder is grossly unremarkable in appearance.  Stomach/Bowel: Normal appearance of the stomach. No pathologic dilatation of small bowel or colon. Numerous colonic diverticulae are noted, particularly in the sigmoid colon, without  surrounding inflammatory changes to suggest an acute diverticulitis at this time. The appendix is not confidently  identified and may be surgically absent. Regardless, there are no inflammatory changes noted adjacent to the cecum to suggest the presence of an acute appendicitis at this time.  Vascular/Lymphatic: Aortic atherosclerosis, without evidence of aneurysm or dissection in the abdominal or pelvic vasculature. No lymphadenopathy noted in the abdomen or pelvis.  Reproductive: Severe median lobe hypertrophy in the prostate gland. Status post TURP. Seminal vesicles are unremarkable in appearance.  Other: No significant volume of ascites.  No pneumoperitoneum.  Musculoskeletal: There are no aggressive appearing lytic or blastic lesions noted in the visualized portions of the skeleton.  IMPRESSION: 1. Nonobstructive calculi in the collecting systems of both kidneys measuring up to 3 mm in the left renal collecting system. No ureteral stones or findings of urinary tract obstruction are noted at this time. 2. Multiple lesions in both kidneys, most of which have benign imaging characteristics. The exception is a 1.5 cm lesion in the medial aspect of the lower pole of the left kidney which is concerning for a small enhancing renal neoplasm. Further characterization with nonemergent MRI of the abdomen with and without IV gadolinium should be considered to better evaluate this finding. 3. Cholelithiasis without evidence of acute cholecystitis. 4. Colonic diverticulosis without findings to suggest an acute diverticulitis at this time. 5. Severe median lobe hypertrophy of the prostate gland in this patient status post TURP. 6. Aortic atherosclerosis, in addition to at least 3 vessel coronary artery disease. 7. Additional incidental findings, as above.   Electronically Signed   By: Trudie Reed M.D.   On: 09/21/2018 15:54  Assessment/ Plan: 83 year old male with  history gross hematuria.  His hematuria is most likely prostatic in origin.  CTU does show small, nonobstructive renal calculi, renal cyst and a 15 mm enhancing lesion in the lower pole left kidney.  We discussed this lesion may represent a small renal cell carcinoma and would recommend monitoring initially.  Follow-up in 3 months with an abdominal MRI with/without contrast.  Return in about 3 months (around 12/23/2018) for Recheck, abdominal MRI results.   Riki Altes, MD

## 2018-09-28 ENCOUNTER — Other Ambulatory Visit: Payer: Self-pay | Admitting: Urology

## 2018-09-30 ENCOUNTER — Telehealth: Payer: Self-pay

## 2018-09-30 NOTE — Telephone Encounter (Signed)
Called and advised patient of urine cytology results. Read Dr Teresa Pelton message and reminded patient of upcoming appt in April 2020.

## 2018-09-30 NOTE — Progress Notes (Unsigned)
Urine cytology showed no suspicious cells.  Follow-up as scheduled

## 2018-12-23 ENCOUNTER — Ambulatory Visit: Payer: Medicare Other | Admitting: Urology

## 2019-01-06 ENCOUNTER — Ambulatory Visit: Payer: Medicare Other | Admitting: Urology

## 2019-01-09 ENCOUNTER — Other Ambulatory Visit: Payer: Self-pay

## 2019-01-09 ENCOUNTER — Ambulatory Visit
Admission: RE | Admit: 2019-01-09 | Discharge: 2019-01-09 | Disposition: A | Discharge status: Home / Self Care | Payer: Medicare Other | Source: Ambulatory Visit | Attending: Urology | Admitting: Urology

## 2019-01-09 DIAGNOSIS — N2889 Other specified disorders of kidney and ureter: Secondary | ICD-10-CM

## 2019-01-09 LAB — POCT I-STAT CREATININE: Creatinine, Ser: 1.4 mg/dL — ABNORMAL HIGH (ref 0.61–1.24)

## 2019-01-09 IMAGING — MR MRI ABDOMEN WITH AND WITHOUT CONTRAST
20 series · 48 of 48 positions shown · IV contrast (gadavist)
Comparison: CT abdomen [DATE]

CLINICAL DATA: Gross hematuria and polyuria. Renal mass in the left
kidney lower pole on CT imaging of [DATE].

EXAM:
MRI ABDOMEN WITHOUT AND WITH CONTRAST
TECHNIQUE: Multiplanar multisequence MR imaging of the abdomen was performed
both before and after the administration of intravenous contrast.
CONTRAST:  9 cc Gadavist

[Series 2: cor haste · coronal · 6.0mm · 1.19mm/px · 2 of 32 slices shown]
[im 1/32]
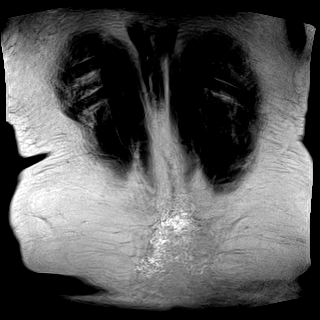
[im 32/32]
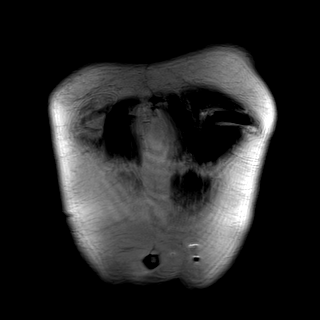

[Series 5: T2 fat-sat · axial · 6.0mm · 1.19mm/px · z∈[-129,+94]mm · 2 of 32 slices shown]
[im 1/32]
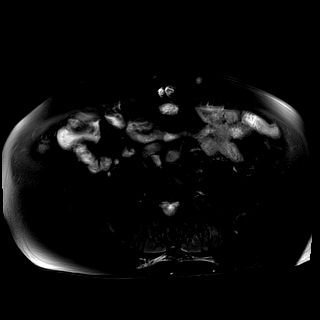
[im 32/32]
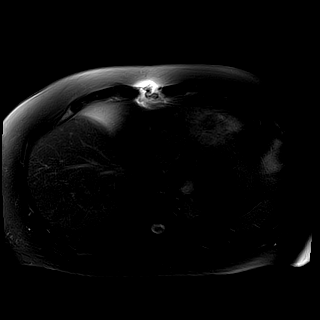

[Series 6: DWI · axial · 6.0mm · 1.42mm/px · z∈[-147,+120]mm · 2 of 38 slices shown (1 of 4)]
[im 1/38]
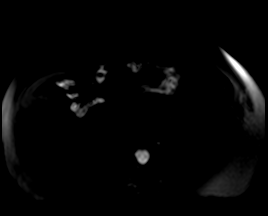
[im 38/38]
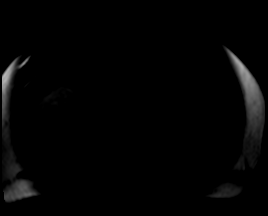

[Series 6: DWI · axial · 6.0mm · 1.42mm/px · z∈[-147,+120]mm · 2 of 38 slices shown (2 of 4)]
[im 1/38]
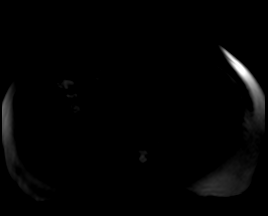
[im 38/38]
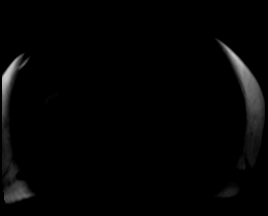

[Series 6: DWI · axial · 6.0mm · 1.42mm/px · 1 of 38 slices shown (3 of 4)]
[im 1/38]
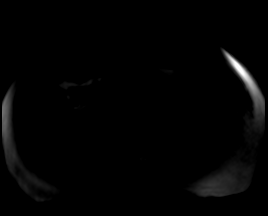

[Series 7: DWI · axial · 6.0mm · 1.42mm/px · 1 of 38 slices shown (4 of 4)]
[im 1/38]
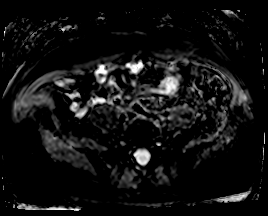

[Series 8: ax in & · axial · 3.0mm · 1.19mm/px · z∈[-148,+113]mm · 3 of 88 slices shown (1 of 2)]
[im 1/88]
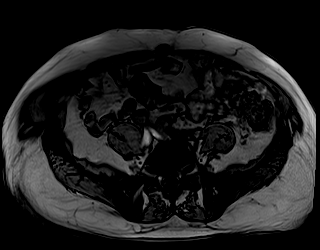
[im 44/88]
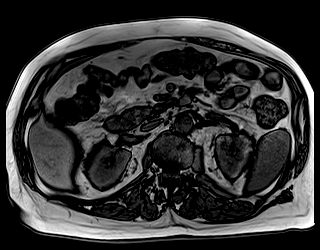
[im 88/88]
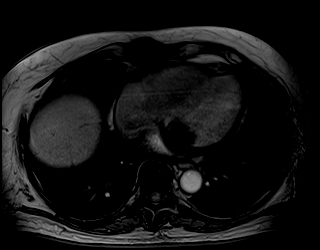

[Series 8: ax in & · axial · 3.0mm · 1.19mm/px · z∈[-148,+113]mm · 3 of 88 slices shown (2 of 2)]
[im 1/88]
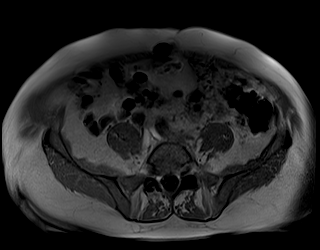
[im 44/88]
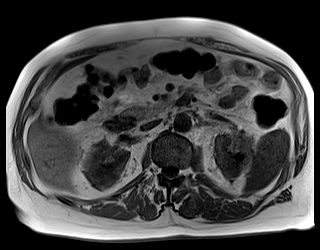
[im 88/88]
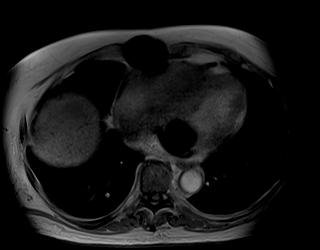

[Series 9: bSSFP · axial · 6.0mm · 0.74mm/px · 1 of 34 slices shown]
[im 1/34]
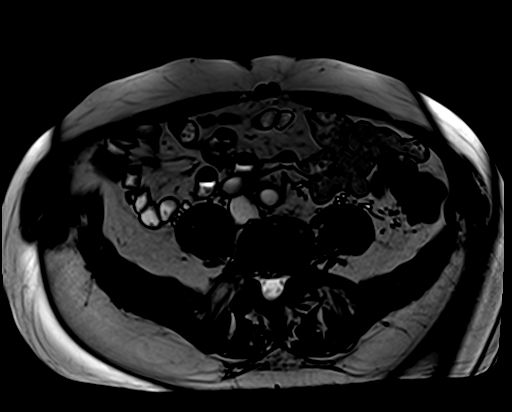

[Series 10: T1 dynamic · axial · non-contrast · 3.0mm · 1.19mm/px · z∈[-137,+100]mm · 3 of 80 slices shown (1 of 9)]
[im 1/80]
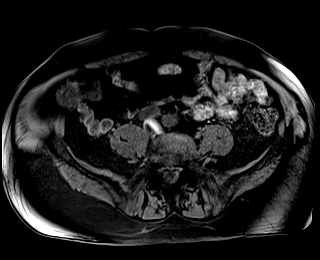
[im 40/80]
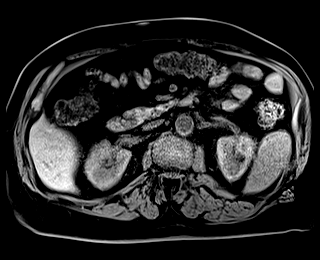
[im 80/80]
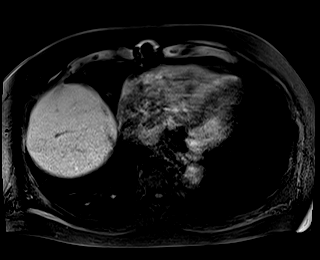

[Series 11: T1 dynamic · axial · 3.0mm · 1.19mm/px · z∈[-137,+100]mm · 3 of 80 slices shown (2 of 9)]
[im 1/80]
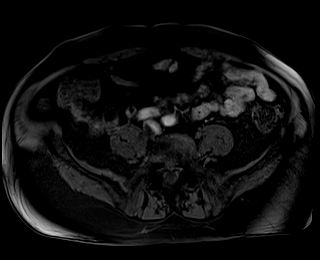
[im 40/80]
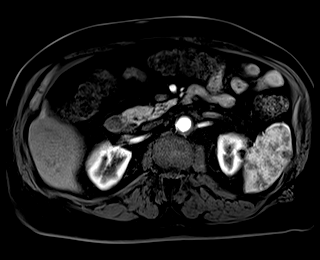
[im 80/80]
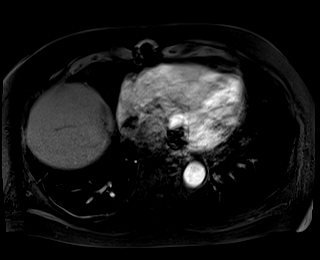

[Series 12: T1 dynamic · axial · 3.0mm · 1.19mm/px · z∈[-137,+100]mm · 3 of 80 slices shown (3 of 9)]
[im 1/80]
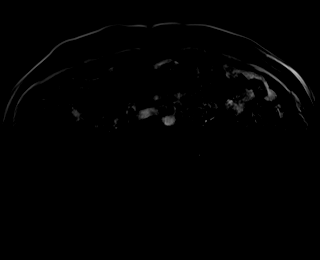
[im 40/80]
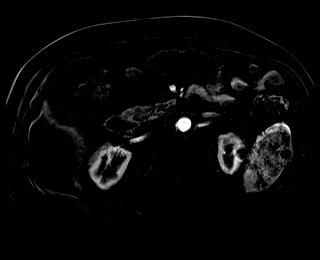
[im 80/80]
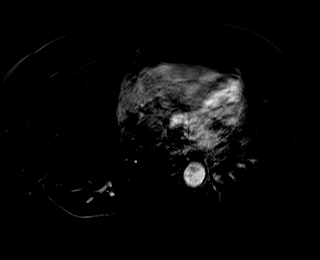

[Series 13: T1 dynamic · axial · 3.0mm · 1.19mm/px · z∈[-137,+100]mm · 3 of 80 slices shown (4 of 9)]
[im 1/80]
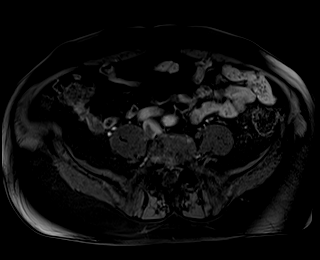
[im 40/80]
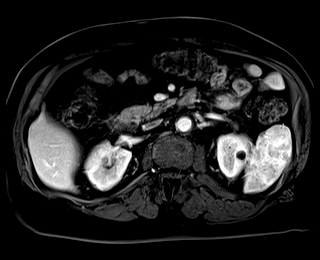
[im 80/80]
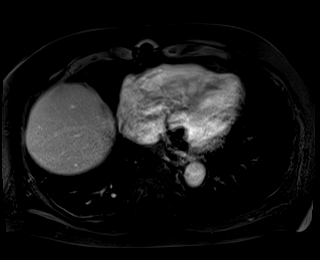

[Series 14: T1 dynamic · axial · 3.0mm · 1.19mm/px · z∈[-137,+100]mm · 3 of 80 slices shown (5 of 9)]
[im 1/80]
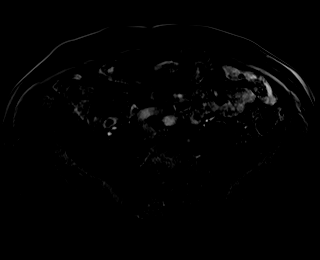
[im 40/80]
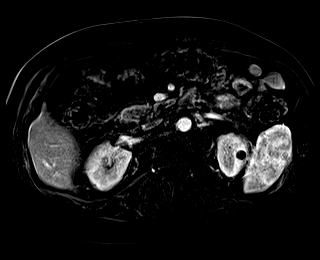
[im 80/80]
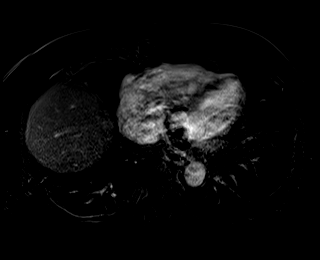

[Series 15: T1 dynamic · axial · 3.0mm · 1.19mm/px · z∈[-137,+100]mm · 3 of 80 slices shown (6 of 9)]
[im 1/80]
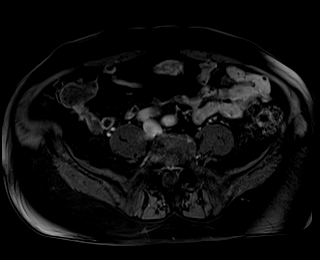
[im 40/80]
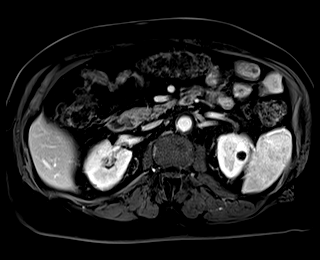
[im 80/80]
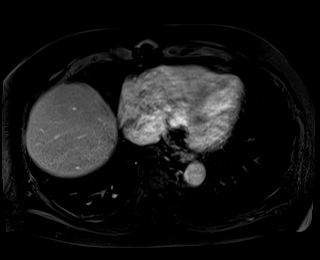

[Series 16: T1 dynamic · axial · 3.0mm · 1.19mm/px · z∈[-137,+100]mm · 3 of 80 slices shown (7 of 9)]
[im 1/80]
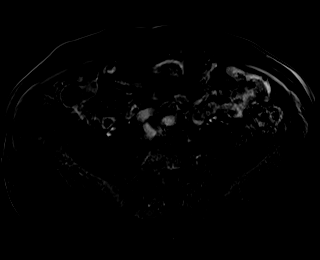
[im 40/80]
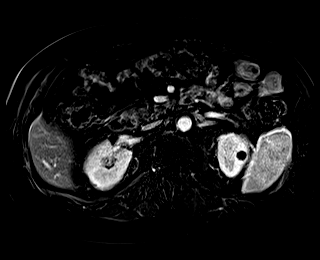
[im 80/80]
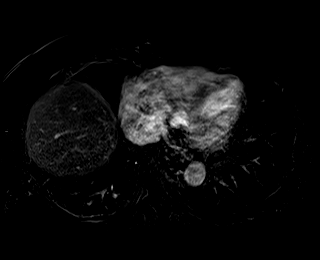

[Series 17: T1 dynamic post-contrast · coronal · 3.0mm · 1.31mm/px · 3 of 80 slices shown]
[im 1/80]
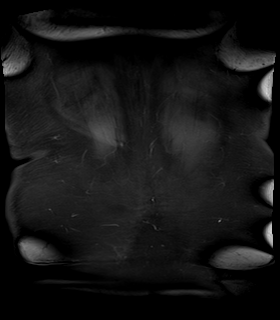
[im 40/80]
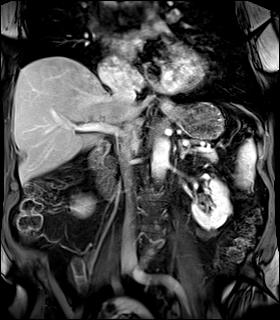
[im 80/80]
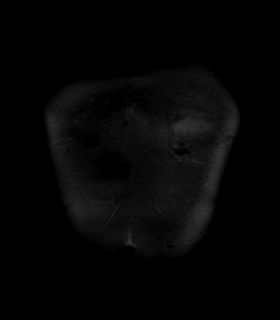

[Series 18: T1 dynamic · axial · 3.0mm · 1.19mm/px · z∈[-137,+100]mm · 3 of 80 slices shown (8 of 9)]
[im 1/80]
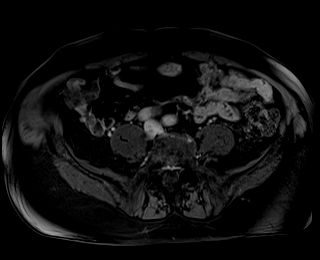
[im 40/80]
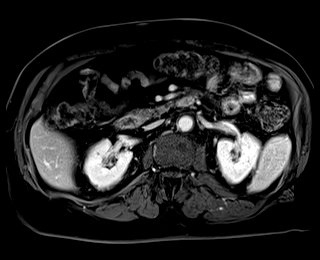
[im 80/80]
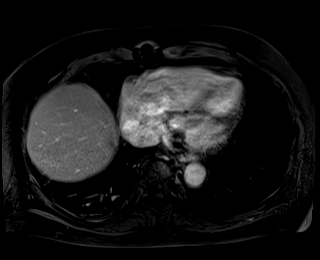

[Series 19: T1 dynamic · axial · 3.0mm · 1.19mm/px · z∈[-137,+100]mm · 3 of 80 slices shown (9 of 9)]
[im 1/80]
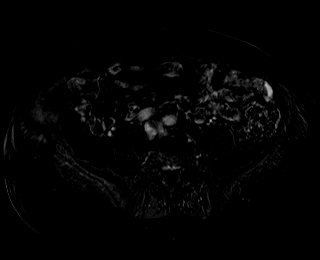
[im 40/80]
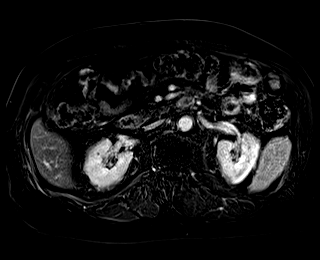
[im 80/80]
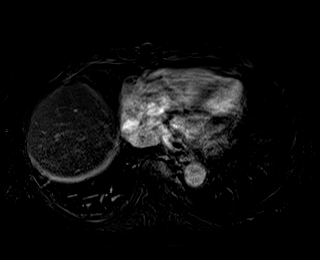

[Series 20: T2 · axial · 6.0mm · 1.19mm/px · 1 of 35 slices shown]
[im 1/35]
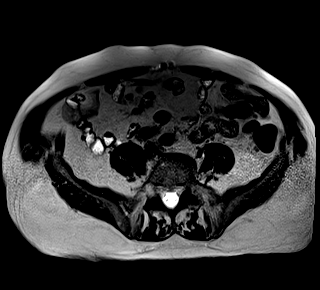

[48 of 48 positions shown; findings below may reference images not displayed]

FINDINGS: Lower chest: Prior median sternotomy.  Prosthetic mitral valve.

Hepatobiliary: Numerous tiny gallstones are present in the
gallbladder. Adherent gallstone versus tiny polyp in the gallbladder
fundus, 2 mm in diameter on image [DATE]. No biliary dilatation.
Subtle 8 mm focus of arterial phase enhancement in the right hepatic
lobe posteriorly on image [DATE], with associated delayed
enhancement, probably a small hemangioma.

Pancreas:  Unremarkable

Spleen: Scattered small hypodense lesions are present in the spleen
and demonstrate primarily accentuated T2 signal. One of the largest
lesions medially in the spleen measures 1.7 cm in diameter on image
[DATE]. These lesions are generally hypoenhancing although are nearly
isoenhancing on the 3 minutes images.

Adrenals/Urinary Tract: Both adrenal glands appear normal. There are
3 Bosniak category 2 cysts of the left kidney lower pole. However,
posteromedially in the left kidney lower pole and corresponding to
the lesion of concern on CT there does appear to be a 1.2 by 0.9 by
1.2 cm small enhancing lesion corresponding to the finding at CT,
suspicious for a small renal cell carcinoma. Additional Bosniak
category 1 cysts of both kidneys are present. Two of the left kidney
lower pole Bosniak category 2 cysts appear to have fluid-fluid
levels.

Adrenal glands normal.

Stomach/Bowel: Descending colon diverticula.

Vascular/Lymphatic: Aortoiliac atherosclerotic vascular disease. No
tumor thrombus in the left renal vein. No pathologic adenopathy.

Other:  No supplemental non-categorized findings.

Musculoskeletal: Mild levoconvex lumbar scoliosis. Hemangioma
eccentric to the left in the L2 vertebral body.
IMPRESSION: 1. Subtle 1.2 by 0.9 by 1.2 cm small enhancing lesion in the left
kidney lower pole medially corresponding to the finding at CT,
suspicious for a small renal cell carcinoma. No adenopathy or tumor
thrombus in the left renal vein.
2. Multiple Bosniak category 1 and category 2 cysts in both kidneys.
3. Scattered hypodense lesions in the spleen are nonspecific
possibilities include littoral cell angioma, splenic hemangiomas,
hamartomas, sarcoidosis, lymphoma, or metastatic disease. The
appearance is highly unlikely to represent metastatic disease from
the very small left renal lesion. Surveillance may be warranted.
4.  Aortic Atherosclerosis ([IT]-[IT]).
5. Descending colon diverticulosis.
6. Gallstones noted. Adherent gallstone along the gallbladder
fundus, versus 2 mm inconsequential polyp.
7. 8 mm focus of arterial phase enhancement posteriorly in the right
hepatic lobe, with delayed enhancement, probably a small hemangioma.

## 2019-01-09 MED ORDER — GADOBUTROL 1 MMOL/ML IV SOLN
9.0000 mL | Freq: Once | INTRAVENOUS | Status: AC | PRN
  Administered 2019-01-09: 14:00:00 9 mL via INTRAVENOUS

## 2019-01-25 ENCOUNTER — Telehealth (INDEPENDENT_AMBULATORY_CARE_PROVIDER_SITE_OTHER): Payer: Medicare Other | Admitting: Urology

## 2019-01-25 ENCOUNTER — Other Ambulatory Visit: Payer: Self-pay

## 2019-01-25 DIAGNOSIS — N2889 Other specified disorders of kidney and ureter: Secondary | ICD-10-CM

## 2019-01-25 NOTE — Progress Notes (Signed)
Virtual Visit via Telephone Note  I connected with Dwayne Robinson on 01/25/19 at 11:30 AM EDT by telephone and verified that I am speaking with the correct person using two identifiers.  Location: Patient: Home Provider: Home   I discussed the limitations, risks, security and privacy concerns of performing an evaluation and management service by telephone and the availability of in person appointments. I also discussed with the patient that there may be a patient responsible charge related to this service. The patient expressed understanding and agreed to proceed.   History of Present Illness: 83 year old male presents for follow-up MRI results via a telephone visit secondary to COVID-19 pandemic.  His wife Dwayne Robinson, who is listed on his DPR, answered the phone and stated he was presently sleeping and ask if I would give her the report.  She states he has done well since his last visit and denies recurrent gross hematuria.  CT urogram in January 2020 showed renal cysts and a 15 mm left lower pole renal mass with suggested enhancement.  An MRI was recommended.  MRI was performed on 01/09/2019.  The left lower pole 12 mm renal mass does enhance and is suspicious for a small RCC.  He also had bilateral renal cysts both simple and Bosniak 2.   Observations/Objective: N/A  Assessment and Plan: 83 year old male with bilateral renal cysts both simple and Bosniak 2.  He has a stable 12-15 mm enhancing left lower pole renal mass.  I did discuss with Dwayne Robinson this most likely represents a small renal cell carcinoma however based on size and stability would recommend continued surveillance.    Follow Up Instructions: Will schedule a renal ultrasound in 6 months.  If the mass cannot be visualized on our [place] will need to get a follow-up CT.   I discussed the assessment and treatment plan with the patient. The patient was provided an opportunity to ask questions and all were answered. The patient  agreed with the plan and demonstrated an understanding of the instructions.   The patient was advised to call back or seek an in-person evaluation if the symptoms worsen or if the condition fails to improve as anticipated.  I provided 7 minutes of non-face-to-face time during this encounter.   Riki Altes, MD

## 2019-02-14 ENCOUNTER — Ambulatory Visit: Payer: Medicare Other | Admitting: Urology

## 2019-07-24 ENCOUNTER — Ambulatory Visit: Payer: Medicare Other | Attending: Urology

## 2019-07-27 ENCOUNTER — Ambulatory Visit: Payer: Medicare Other | Admitting: Urology

## 2019-09-25 ENCOUNTER — Other Ambulatory Visit: Payer: Self-pay | Admitting: Neurology

## 2019-09-25 DIAGNOSIS — R4189 Other symptoms and signs involving cognitive functions and awareness: Secondary | ICD-10-CM

## 2019-10-03 ENCOUNTER — Other Ambulatory Visit: Payer: Self-pay

## 2019-10-03 ENCOUNTER — Ambulatory Visit
Admission: RE | Admit: 2019-10-03 | Discharge: 2019-10-03 | Disposition: A | Discharge status: Home / Self Care | Payer: Medicare PPO | Source: Ambulatory Visit | Attending: Neurology | Admitting: Neurology

## 2019-10-03 DIAGNOSIS — R4189 Other symptoms and signs involving cognitive functions and awareness: Secondary | ICD-10-CM | POA: Insufficient documentation

## 2019-10-03 IMAGING — MR MR HEAD W/O CM
12 series · 43 of 48 positions shown · non-contrast
Comparison: None.

CLINICAL DATA: Cognitive impairment

EXAM:
MRI HEAD WITHOUT CONTRAST
TECHNIQUE: Multiplanar, multiecho pulse sequences of the brain and surrounding
structures were obtained without intravenous contrast.

[Series 5: ax dwi_tracew · axial · 3.0mm · 0.60mm/px · z∈[-58,+90]mm · 4 of 48 slices shown]
[im 1/48]
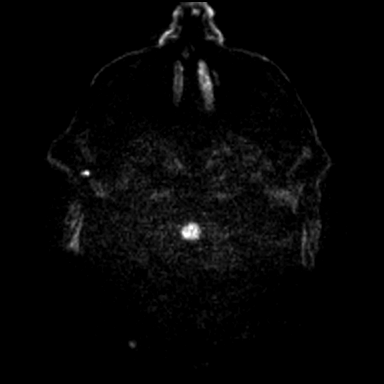
[im 16/48]
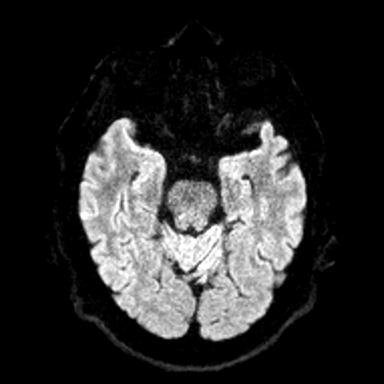
[im 32/48]
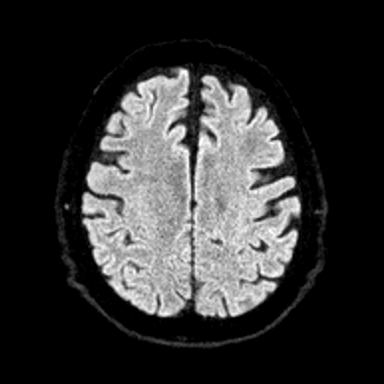
[im 48/48]
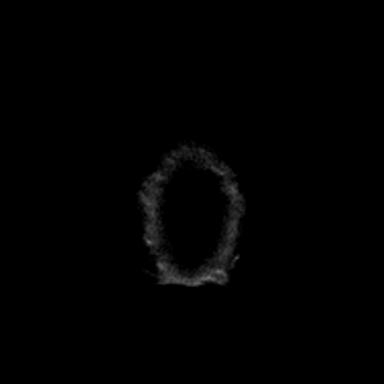

[Series 6: ax dwi_adc · axial · 3.0mm · 0.60mm/px · z∈[-58,+90]mm · 3 of 48 slices shown]
[im 1/48]
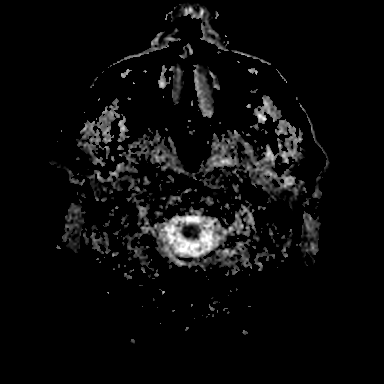
[im 24/48]
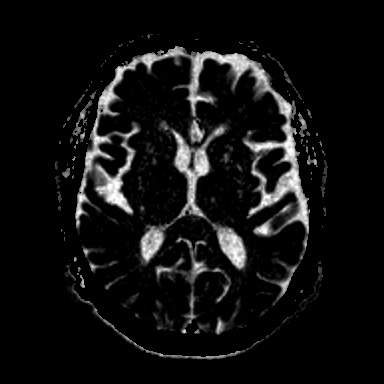
[im 48/48]
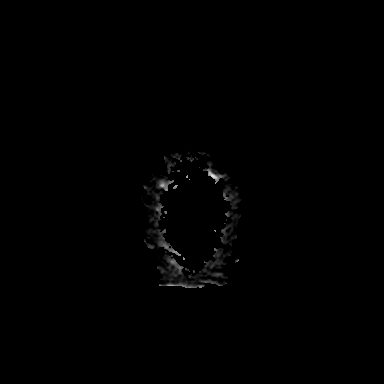

[Series 7: cor dwi_tracew · coronal · 5.0mm · 0.60mm/px · 3 of 38 slices shown]
[im 1/38]
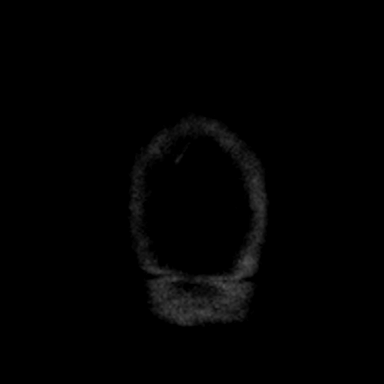
[im 19/38]
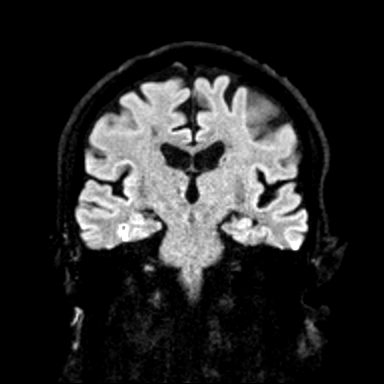
[im 38/38]
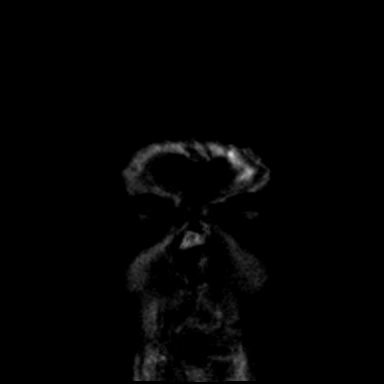

[Series 8: cor dwi_adc · coronal · 5.0mm · 0.60mm/px · 3 of 38 slices shown]
[im 1/38]
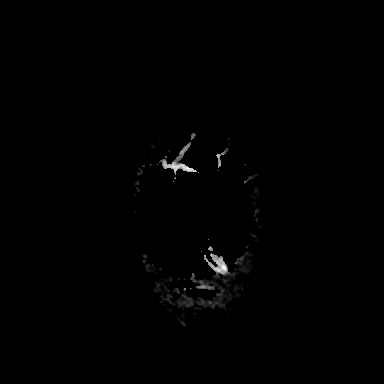
[im 19/38]
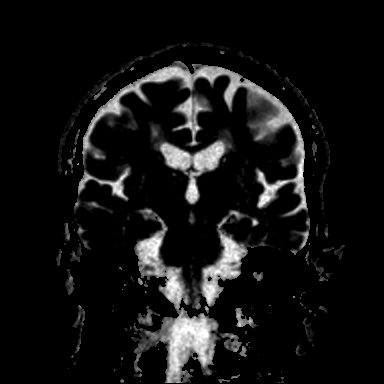
[im 38/38]
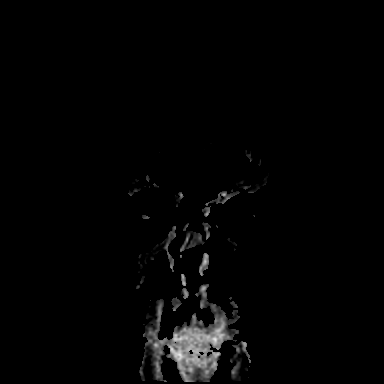

[Series 9: T1 · sagittal · 5.0mm · 0.62mm/px · 2 of 25 slices shown (1 of 2)]
[im 1/25]
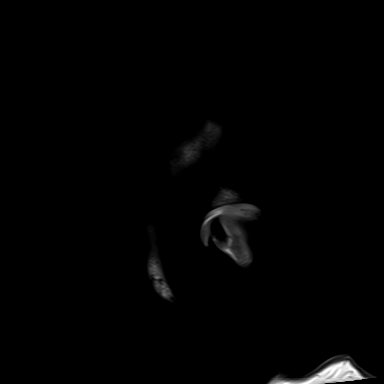
[im 25/25]
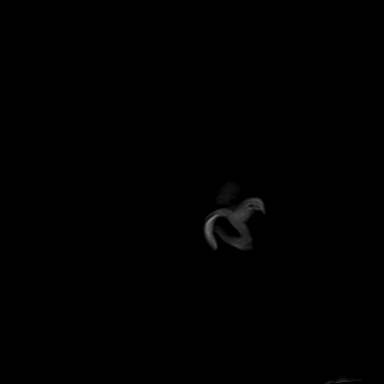

[Series 10: T2 · axial · 5.0mm · 0.53mm/px · z∈[-61,+88]mm · 2 of 27 slices shown (1 of 2)]
[im 1/27]
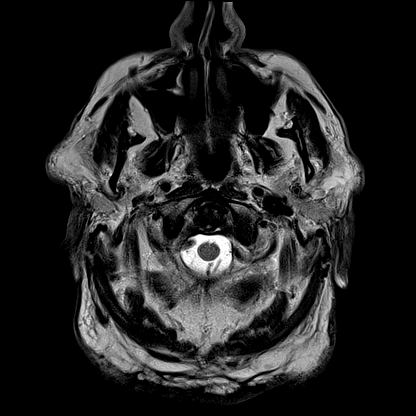
[im 27/27]
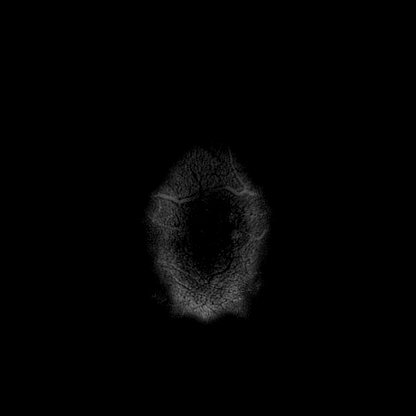

[Series 11: mag_images · axial · 3.0mm · 0.90mm/px · z∈[-69,+100]mm · 4 of 60 slices shown]
[im 1/60]
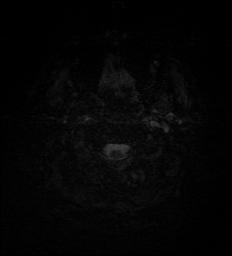
[im 20/60]
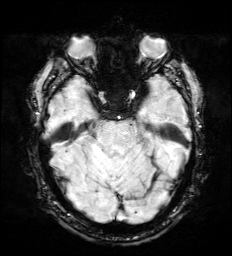
[im 40/60]
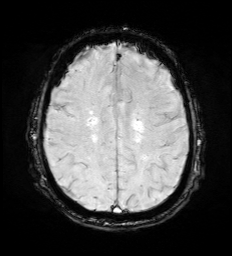
[im 60/60]
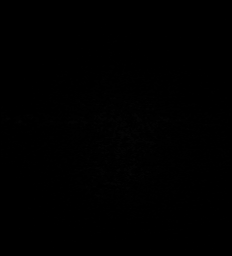

[Series 12: pha_images · axial · 3.0mm · 0.90mm/px · z∈[-69,+94]mm · 4 of 58 slices shown]
[im 1/58]
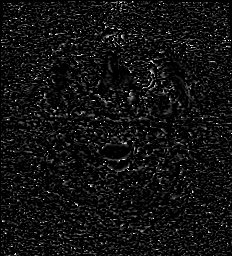
[im 20/58]
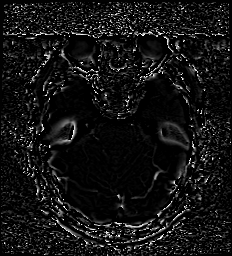
[im 39/58]
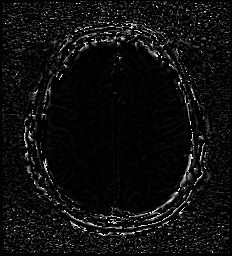
[im 58/58]
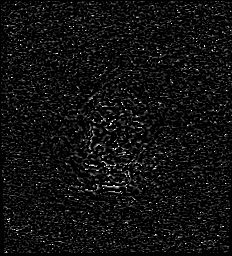

[Series 13: swi_images · axial · 3.0mm · 0.90mm/px · z∈[-69,+100]mm · 4 of 60 slices shown]
[im 1/60]
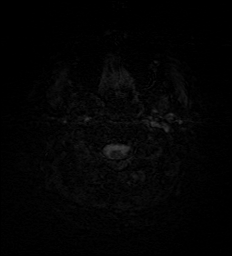
[im 20/60]
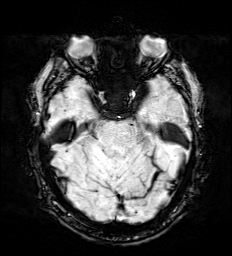
[im 40/60]
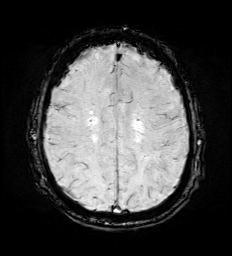
[im 60/60]
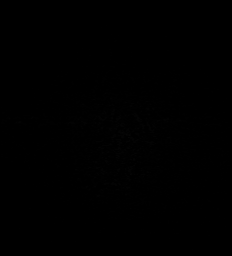

[Series 15: FLAIR · axial · 3.0mm · 0.53mm/px · z∈[-64,+91]mm · 4 of 55 slices shown]
[im 1/55]
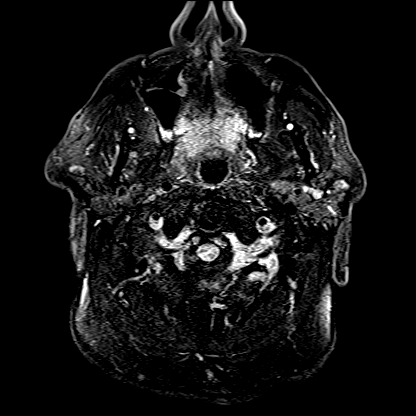
[im 19/55]
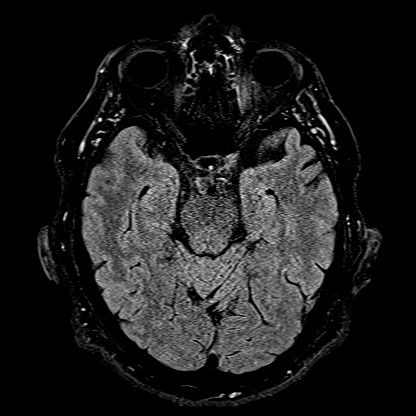
[im 37/55]
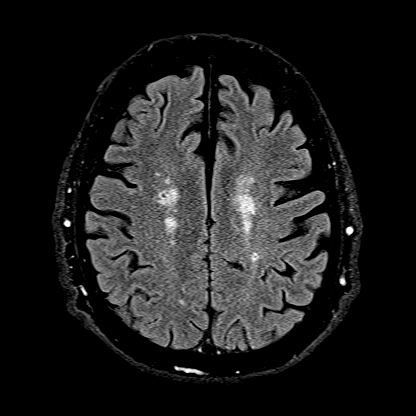
[im 55/55]
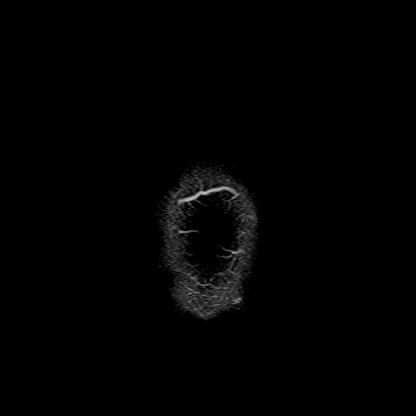

[Series 16: T1 · axial · 1.0mm · 0.98mm/px · z∈[-71,+96]mm · 8 of 176 slices shown (2 of 2)]
[im 1/176]
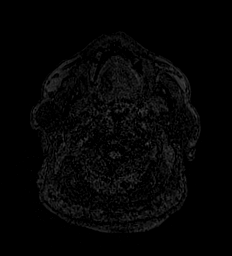
[im 30/176]
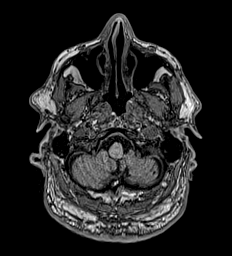
[im 59/176]
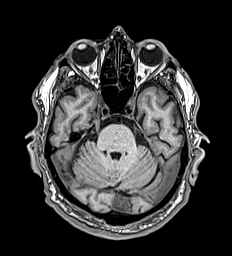
[im 73/176]
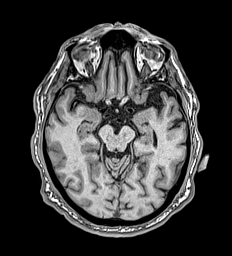
[im 103/176]
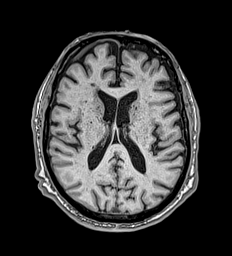
[im 117/176]
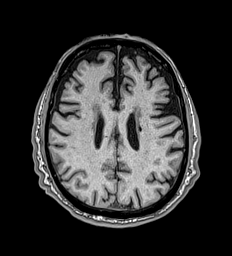
[im 146/176]
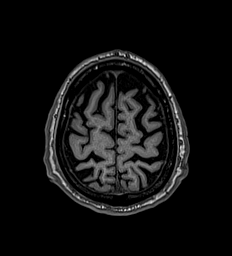
[im 176/176]
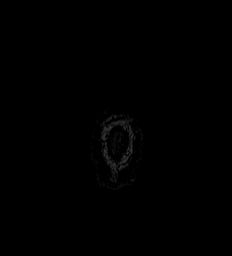

[Series 17: T2 · coronal · 5.0mm · 0.57mm/px · 2 of 29 slices shown (2 of 2)]
[im 1/29]
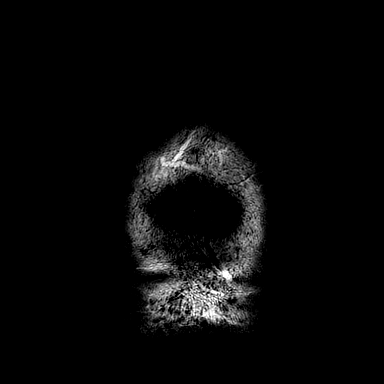
[im 29/29]
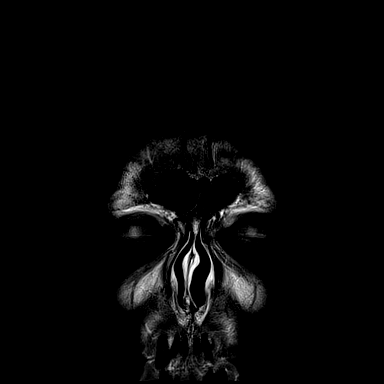

[43 of 48 positions shown; findings below may reference images not displayed]

FINDINGS: Brain: 16 mm round signal abnormality at the level of the splenium
of the corpus callosum. On sagittal T2 weighted imaging this appears
to be within the splenium but on axial especially T2 and FLAIR
imaging and appears external with mass effect. There is a tiny
nodule left para median from the mid falx measuring 4 mm. Both of
these could reflect meningioma, but need further characterization.
There is no known history of active malignancy per the chart. An
intra-axial splenial lesion would be rare in an outpatient and would
need correlation for any offending medication or seizure history.

Small vessel ischemic type change in the deep cerebral white matter
with multiple lacunes in the centrum semiovale. There has been a
small remote high left frontal cortex infarct.

Remote microhemorrhage at the right thalamus, usually hypertensive
in this location.

Brain volume is normal.  No hydrocephalus or extra-axial collection.

Vascular: Normal flow voids

Skull and upper cervical spine: Negative for marrow lesion

Sinuses/Orbits: Negative
IMPRESSION: 1. 16 mm abnormality at the level of the splenium of the corpus
callosum, favored extra-axial rather than intra-axial. This could
reflect a meningioma or other extra-axial mass and postcontrast
imaging is recommended. Further discussion above.
2. Chronic small vessel ischemic injury in the deep cerebral white
matter with single small remote left frontal cortex infarct.
3. History of dementia but brain volume is well preserved for age.
4. Possible 4 mm falx meningioma.

## 2019-10-11 ENCOUNTER — Encounter: Payer: Self-pay | Admitting: Physical Therapy

## 2019-10-11 ENCOUNTER — Other Ambulatory Visit: Payer: Self-pay

## 2019-10-11 ENCOUNTER — Ambulatory Visit: Payer: Medicare PPO | Attending: Neurology | Admitting: Physical Therapy

## 2019-10-11 DIAGNOSIS — R41841 Cognitive communication deficit: Secondary | ICD-10-CM | POA: Diagnosis present

## 2019-10-11 DIAGNOSIS — R262 Difficulty in walking, not elsewhere classified: Secondary | ICD-10-CM

## 2019-10-11 DIAGNOSIS — M6281 Muscle weakness (generalized): Secondary | ICD-10-CM | POA: Diagnosis present

## 2019-10-11 DIAGNOSIS — R2689 Other abnormalities of gait and mobility: Secondary | ICD-10-CM | POA: Insufficient documentation

## 2019-10-11 NOTE — Therapy (Signed)
[facility] MAIN [facility] SERVICES [address] [place], [place], [address] Phone: [phone]   Fax:  [phone]  Physical Therapy Evaluation  Patient Details  Name: Dwayne Robinson MRN: [id number] Date of Birth: Sep 14, 1935 Referring Provider (PT): Cristopher Peru   Encounter Date: 10/11/2019  PT End of Session - 10/11/19 1316    Visit Number  1    Number of Visits  17    Date for PT Re-Evaluation  12/06/19    PT Start Time  0110    PT Stop Time  0200    PT Time Calculation (min)  50 min    Equipment Utilized During Treatment  Gait belt    Activity Tolerance  Patient tolerated treatment well    Behavior During Therapy  WFL for tasks assessed/performed       Past Medical History:  Diagnosis Date  . A-fib (HCC)   . Arthritis   . Bleeding disorder (HCC)   . GERD (gastroesophageal reflux disease)   . Heart attack (HCC)   . Heart disease   . Heart failure (HCC)   . Heart murmur     Past Surgical History:  Procedure Laterality Date  . CARDIAC SURGERY    . CARDIAC VALVE SURGERY      There were no vitals filed for this visit.     Subjective Assessment - 10/11/19 1324    Subjective  Pateint has numbness in BLE and has balance loss.    Pertinent History  Patient has had foot neuropathy for 15 years ago. He says that both feet are cold all the way to the knees. He does not use an AD. He has difficutly with steps and climbing up a ladder. He has had some falls several months ago.    Limitations  Standing    How long can you stand comfortably?  15 mins    How long can you walk comfortably?  15 mins    Patient Stated Goals  Patient wants to get better.    Currently in Pain?  Yes    Pain Score  2     Pain Location  Foot    Pain Orientation  Right;Left    Pain Descriptors / Indicators  Aching    Pain Type  Chronic pain    Pain Onset  More than a month ago    Pain Frequency  Intermittent    Aggravating Factors   letting his feet  get cold    Pain Relieving Factors  keeping them warm    Effect of Pain on Daily Activities  slows him down    Multiple Pain Sites  No         [facility] PT Assessment - 10/11/19 1328      Assessment   Medical Diagnosis  neuropathy    Referring Provider (PT)  Sherryll Burger, [place]    Onset Date/Surgical Date  10/05/19    Hand Dominance  Right    Prior Therapy  no      Precautions   Precautions  Fall      Restrictions   Weight Bearing Restrictions  No      Balance Screen   Has the patient fallen in the past 6 months  No    Has the patient had a decrease in activity level because of a fear of falling?   Yes    Is the patient reluctant to leave their home because of a fear of falling?   No  Home Environment   Living Environment  Private residence    Living Arrangements  Spouse/significant other    Available Help at Discharge  Family    Type of Home  House    Home Access  Stairs to enter    Entrance Stairs-Number of Steps  1    Entrance Stairs-Rails  Right    Home Layout  Two level    Alternate Level Stairs-Number of Steps  12    Alternate Level Stairs-Rails  Right;Left;Can reach both      Prior Function   Level of Independence  Independent with household mobility without device    Vocation  Retired    Leisure  TV      Cognition   Overall Cognitive Status  Within Functional Limits for tasks assessed      Standardized Balance Assessment   Standardized Balance Assessment  Secretary/administrator Test   Sit to Stand  Able to stand without using hands and stabilize independently    Standing Unsupported  Able to stand safely 2 minutes    Sitting with Back Unsupported but Feet Supported on Floor or Stool  Able to sit safely and securely 2 minutes    Stand to Sit  Sits safely with minimal use of hands    Transfers  Able to transfer safely, minor use of hands    Standing Unsupported with Eyes Closed  Able to stand 10 seconds safely    Standing Unsupported with Feet  Together  Able to place feet together independently and stand 1 minute safely    From Standing, Reach Forward with Outstretched Arm  Reaches forward but needs supervision    From Standing Position, Pick up Object from Floor  Able to pick up shoe safely and easily    From Standing Position, Turn to Look Behind Over each Shoulder  Turn sideways only but maintains balance    Turn 360 Degrees  Able to turn 360 degrees safely one side only in 4 seconds or less    Standing Unsupported, Alternately Place Feet on Step/Stool  Able to stand independently and complete 8 steps >20 seconds    Standing Unsupported, One Foot in Front  Needs help to step but can hold 15 seconds    Standing on One Leg  Unable to try or needs assist to prevent fall    Total Score  42        PAIN: 2/10- 5/10  B feet from ankle to knee  POSTURE: WNL   PROM/AROM: WFL BLE and BUE  STRENGTH:  Graded on a 0-5 scale Muscle Group Left Right                          Hip Flex 4/5 4/5  Hip Abd 4/5 4/5      Hip Ext 3/5 3/5      Knee Flex 4/5 4/5  Knee Ext 4/5 4/5  Ankle DF 3/5  3/5  Ankle PF 2/5 2/5   SENSATION: Numbness B feet/ ankles and below B knees    FUNCTIONAL MOBILITY: MI supine <> sit , MI rolling left and right    BALANCE: Static Standing Balance  Normal Able to maintain standing balance against maximal resistance   Good Able to maintain standing balance against moderate resistance   Good-/Fair+ Able to maintain standing balance against minimal resistance x  Fair Able to stand unsupported without UE support and without LOB  for 1-2 min   Fair- Requires Min A and UE support to maintain standing without loss of balance   Poor+ Requires mod A and UE support to maintain standing without loss of balance   Poor Requires max A and UE support to maintain standing balance without loss     Static Sitting Balance  Normal Able to maintain balance against maximal resistance x  Good Able to maintain balance  against moderate resistance   Good-/Fair+ Accepts minimal resistance   Fair Able to sit unsupported without balance loss and without UE support   Poor+ Able to maintain with Minimal assistance from individual or chair   Poor Unable to maintain balance-requires mod/max support from individual or chair    Standing Dynamic Balance  Normal Stand independently unsupported, able to weight shift and cross midline maximally   Good Stand independently unsupported, able to weight shift and cross midline moderately   Good-/Fair+ Stand independently unsupported, able to weight shift across midline minimally x  Fair Stand independently unsupported, weight shift, and reach ipsilaterally, loss of balance when crossing midline   Poor+ Able to stand with Min A and reach ipsilaterally, unable to weight shift   Poor Able to stand with Mod A and minimally reach ipsilaterally, unable to cross midline.      GAIT: Patient ambulates with path deviation with faster gait speed, no AD with foot slap with increased distances.  OUTCOME MEASURES: TEST Outcome Interpretation  5 times sit<>stand 16.72sec >35 yo, >15 sec indicates increased risk for falls  10 meter walk test     .85             m/s <1.0 m/s indicates increased risk for falls; limited community ambulator  Timed up and Go  13.33               sec <14 sec indicates increased risk for falls  6 minute walk test    deferred to next visit            Feet 1000 feet is community Financial controller 42.56 <36/56 (100% risk for falls), 37-45 (80% risk for falls); 46-51 (>50% risk for falls); 52-55 (lower risk <25% of falls)          Foto score: 55  Computer generated score: 51 Discharge score : 62       Objective measurements completed on examination: See above findings.                PT Short Term Goals - 10/11/19 1319      PT SHORT TERM GOAL #1   Title  Patient will be independent in home exercise program to improve  strength/mobility for better functional independence with ADLs.    Time  8    Period  Weeks    Status  New    Target Date  11/08/19        PT Long Term Goals - 10/11/19 1320      PT LONG TERM GOAL #1   Title  Patient (> 53 years old) will complete five times sit to stand test in < 15 seconds indicating an increased LE strength and improved balance.    Time  8    Period  Weeks    Status  New    Target Date  12/05/19      PT LONG TERM GOAL #2   Title  Patient will increase Berg Balance score by > 6 points to demonstrate decreased fall risk  during functional activities.    Time  8    Period  Weeks    Status  New    Target Date  12/06/19      PT LONG TERM GOAL #3   Title  Patient will increase six minute walk test distance to >1000 for progression to community ambulator and improve gait ability    Time  8    Period  Weeks    Status  New    Target Date  12/06/19      PT LONG TERM GOAL #4   Title  Patient will increase 10 meter walk test to >1.61m/s as to improve gait speed for better community ambulation and to reduce fall risk.    Time  8    Period  Weeks    Status  New    Target Date  12/06/19             Plan - 10/11/19 1317    Clinical Impression Statement  PT examination reveals grossly weak B LE ankles and poor sensation in B ankles,  with heavy UE reliance for sit to stand transfers. Patient has significantly impaired balance Berg 42/56, decreased LE power noted with 5 x sit to stand, and TUG  with primary difficulty with performing sit to stand phase of test. Gait speed is also unsteady with decreased gait speed. Pt will benefit from skilled PT services to address deficits in balance and strength in order to improve function and decrease fall risk.     Personal Factors and Comorbidities  Age    Clinical Decision Making  Low    Rehab Potential  Good    PT Frequency  2x / week    PT Duration  8 weeks    PT Treatment/Interventions  Manual techniques;Neuromuscular  re-education;Balance training;Therapeutic exercise;Functional mobility training;Therapeutic activities    PT Next Visit Plan  balance training    PT Home Exercise Plan  patient arrived late    Consulted and Agree with Plan of Care  Patient       Patient will benefit from skilled therapeutic intervention in order to improve the following deficits and impairments:  Abnormal gait, Decreased strength, Difficulty walking, Decreased balance, Pain, Decreased mobility  Visit Diagnosis: Muscle weakness (generalized)  Difficulty in walking, not elsewhere classified  Other abnormalities of gait and mobility     Problem List Patient Active Problem List   Diagnosis Date Noted  . Renal mass 01/25/2019  . History of MI (myocardial infarction) 09/23/2018  . LVH (left ventricular hypertrophy) due to hypertensive disease, without heart failure 08/08/2018  . Dizziness 12/02/2017  . SOBOE (shortness of breath on exertion) 10/13/2017  . Atrial flutter, paroxysmal ([place]) 12/02/2016  . CAD (coronary artery disease) 10/17/2014  . Atherosclerosis of both carotid arteries 06/27/2014  . Benign essential HTN 06/27/2014  . Chronic systolic CHF (congestive heart failure), NYHA class 3 ([place]) 06/27/2014  . Mixed hyperlipidemia 06/27/2014  . Paroxysmal A-fib ([place]) 06/27/2014  . Severe mitral insufficiency 06/27/2014  . Elevated prostate specific antigen (PSA) 07/05/2012  . Enlarged prostate with lower urinary tract symptoms (LUTS) 07/05/2012  . Microscopic hematuria 07/05/2012    Alanson Puls, PT DPT 10/11/2019, 2:19 PM  [place] MAIN [facility] SERVICES [address] [place], [place], [address] Phone: [phone]   Fax:  [phone]  Name: Dwayne Robinson MRN: [id number] Date of Birth: 1936/01/25

## 2019-10-11 NOTE — Patient Instructions (Signed)
Heel Raise: Bilateral (Standing)    Rise on balls of feet and hold for 2 seconds. Repeat __20__ times per set. Do _2___ sets per session. Do ___2_ sessions per day.  http://orth.exer.us/38   Copyright  VHI. All rights reserved.

## 2019-10-12 ENCOUNTER — Other Ambulatory Visit (HOSPITAL_COMMUNITY): Payer: Self-pay | Admitting: Neurology

## 2019-10-12 ENCOUNTER — Other Ambulatory Visit: Payer: Self-pay | Admitting: Neurology

## 2019-10-12 DIAGNOSIS — G9389 Other specified disorders of brain: Secondary | ICD-10-CM

## 2019-10-16 ENCOUNTER — Other Ambulatory Visit: Payer: Self-pay

## 2019-10-16 ENCOUNTER — Encounter: Payer: Self-pay | Admitting: Physical Therapy

## 2019-10-16 ENCOUNTER — Ambulatory Visit: Payer: Medicare PPO | Admitting: Physical Therapy

## 2019-10-16 DIAGNOSIS — R2689 Other abnormalities of gait and mobility: Secondary | ICD-10-CM

## 2019-10-16 DIAGNOSIS — R262 Difficulty in walking, not elsewhere classified: Secondary | ICD-10-CM

## 2019-10-16 DIAGNOSIS — M6281 Muscle weakness (generalized): Secondary | ICD-10-CM | POA: Diagnosis not present

## 2019-10-16 NOTE — Patient Instructions (Signed)
Hip Extension (Standing)    Stand with support. Squeeze pelvic floor and hold. Move right leg backward with straight knee. Tie green theraband around ankles Repeat _15__ times. Do __2_ times a day. Repeat with other leg.    Copyright  VHI. All rights reserved.  HIP: Abduction - Standing (Band)    Place band around legs. Squeeze glutes. Raise leg out and slightly back.  Use ____green____ band. __15_ reps per set, _2__ sets per day, __7_ days per week Hold onto a support.  Copyright  VHI. All rights reserved.

## 2019-10-16 NOTE — Therapy (Signed)
[place] MAIN [facility] SERVICES [address] [place], [place], [address] Phone: [phone]   Fax:  [phone]  Physical Therapy Treatment  Patient Details  Name: Dwayne Robinson MRN: [id number] Date of Birth: 05-17-1936 Referring Provider (PT): Jennings Books   Encounter Date: 10/16/2019  PT End of Session - 10/16/19 1107    Visit Number  2    Number of Visits  17    Date for PT Re-Evaluation  12/06/19    PT Start Time  1100    PT Stop Time  1140    PT Time Calculation (min)  40 min    Equipment Utilized During Treatment  Gait belt    Activity Tolerance  Patient tolerated treatment well    Behavior During Therapy  [facility] for tasks assessed/performed       Past Medical History:  Diagnosis Date  . A-fib ([place])   . Arthritis   . Bleeding disorder ([place])   . GERD (gastroesophageal reflux disease)   . Heart attack ([place])   . Heart disease   . Heart failure ([place])   . Heart murmur     Past Surgical History:  Procedure Laterality Date  . CARDIAC SURGERY    . CARDIAC VALVE SURGERY      There were no vitals filed for this visit.  Subjective Assessment - 10/16/19 1106    Subjective  Pateint has numbness in BLE and has balance loss.[name] He did his exercises over the weekend.    Pertinent History  Patient has had foot neuropathy for 15 years ago. He says that both feet are cold all the way to the knees. He does not use an AD. He has difficutly with steps and climbing up a ladder. He has had some falls several months ago.    Limitations  Standing    How long can you stand comfortably?  15 mins    How long can you walk comfortably?  15 mins    Patient Stated Goals  Patient wants to get better.    Currently in Pain?  No/denies    Pain Score  0-No pain    Pain Onset  More than a month ago       Treatment:   Ther-ex  Octane fitness x 5 mins  Hip flexion marches with 2# ankle weights x 10 bilateral; Hip extension with 2# x 10  bilateral; Lunges to BOSU ball x 15 BLE Heel raises, 2 second hold  x 15 x 2 sets Step ups to 6-inch stool x 20  Eccentric step downs from 3 inch stool x 10 verbal cues to complete slow and tap heel.  BUE CGA  Neuromuscular training 1/2 foam heel / toe rock flat side up x 2 mins  1/2 foam tandem flat side down  x 2 mins  Standing on BOSU x 1 minute , cues to shift weight to toes: side stepping left and right in parallel bars 10 feet x 3 standing on blue foam with head turns x 1 min  Standing feet together on blue foam with head turns x 1 min step ups from floor to 6 inch stool x 20 bilateral Step ups from blue foam to 6 inch stool x 20 BLE    Pt educated throughout session about proper posture and technique with exercises. Improved exercise technique, movement at target joints, use of target muscles after min to mod verbal, visual, tactile cues. CGA and Min to mod verbal cues used throughout with  increased in postural sway and LOB most seen with narrow base of support and while on uneven surfaces. Continues to have balance deficits typical with diagnosis Patient needs occasional verbal cueing to improve posture and cueing to correctly perform exercises slowly, holding at end of range to increase motor firing of desired muscle to encourage fatigue.                        PT Education - 10/16/19 1106    Education Details  HEP    Person(s) Educated  Patient    Methods  Explanation;Demonstration    Comprehension  Verbalized understanding;Returned demonstration;Need further instruction       PT Short Term Goals - 10/11/19 1319      PT SHORT TERM GOAL #1   Title  Patient will be independent in home exercise program to improve strength/mobility for better functional independence with ADLs.    Time  8    Period  Weeks    Status  New    Target Date  11/08/19        PT Long Term Goals - 10/11/19 1320      PT LONG TERM GOAL #1   Title  Patient (> 40 years old)  will complete five times sit to stand test in < 15 seconds indicating an increased LE strength and improved balance.    Time  8    Period  Weeks    Status  New    Target Date  12/05/19      PT LONG TERM GOAL #2   Title  Patient will increase Berg Balance score by > 6 points to demonstrate decreased fall risk during functional activities.    Time  8    Period  Weeks    Status  New    Target Date  12/06/19      PT LONG TERM GOAL #3   Title  Patient will increase six minute walk test distance to >1000 for progression to community ambulator and improve gait ability    Time  8    Period  Weeks    Status  New    Target Date  12/06/19      PT LONG TERM GOAL #4   Title  Patient will increase 10 meter walk test to >1.9m/s as to improve gait speed for better community ambulation and to reduce fall risk.    Time  8    Period  Weeks    Status  New    Target Date  12/06/19            Plan - 10/16/19 1109    Clinical Impression Statement  Patient instructed in beginning balance and coordination exercise. Patient required mod VCs and min A for gait to improve weight shift and postural control. Patient requires min VCs to improve ankle stability with gait.  Patients would benefit from additional skilled PT intervention to improve balance/gait safety and reduce fall risk.   Personal Factors and Comorbidities  Age    Examination-Activity Limitations  Research officer, political party Overhead    Examination-Participation Restrictions  Community Activity    Rehab Potential  Good    PT Frequency  2x / week    PT Duration  8 weeks    PT Treatment/Interventions  Manual techniques;Neuromuscular re-education;Balance training;Therapeutic exercise;Functional mobility training;Therapeutic activities    PT Next Visit Plan  balance training    PT Home Exercise Plan  patient arrived late    Consulted  and Agree with Plan of Care  Patient       Patient will benefit from skilled therapeutic intervention in  order to improve the following deficits and impairments:  Abnormal gait, Decreased strength, Difficulty walking, Decreased balance, Pain, Decreased mobility  Visit Diagnosis: Muscle weakness (generalized)  Difficulty in walking, not elsewhere classified  Other abnormalities of gait and mobility     Problem List Patient Active Problem List   Diagnosis Date Noted  . Renal mass 01/25/2019  . History of MI (myocardial infarction) 09/23/2018  . LVH (left ventricular hypertrophy) due to hypertensive disease, without heart failure 08/08/2018  . Dizziness 12/02/2017  . SOBOE (shortness of breath on exertion) 10/13/2017  . Atrial flutter, paroxysmal (HCC) 12/02/2016  . CAD (coronary artery disease) 10/17/2014  . Atherosclerosis of both carotid arteries 06/27/2014  . Benign essential HTN 06/27/2014  . Chronic systolic CHF (congestive heart failure), NYHA class 3 (HCC) 06/27/2014  . Mixed hyperlipidemia 06/27/2014  . Paroxysmal A-fib (HCC) 06/27/2014  . Severe mitral insufficiency 06/27/2014  . Elevated prostate specific antigen (PSA) 07/05/2012  . Enlarged prostate with lower urinary tract symptoms (LUTS) 07/05/2012  . Microscopic hematuria 07/05/2012    Ezekiel Ina, PT DPT 10/16/2019, 11:09 AM  [place] [facility] MAIN [facility] SERVICES [address] [place], [place], [address] Phone: [phone]   Fax:  [phone]  Name: Brennin Durfee MRN: [id number] Date of Birth: [birth year]

## 2019-10-18 ENCOUNTER — Ambulatory Visit: Payer: Medicare PPO | Admitting: Physical Therapy

## 2019-10-18 ENCOUNTER — Encounter: Payer: Self-pay | Admitting: Physical Therapy

## 2019-10-18 ENCOUNTER — Other Ambulatory Visit: Payer: Self-pay

## 2019-10-18 DIAGNOSIS — M6281 Muscle weakness (generalized): Secondary | ICD-10-CM

## 2019-10-18 DIAGNOSIS — R2689 Other abnormalities of gait and mobility: Secondary | ICD-10-CM

## 2019-10-18 DIAGNOSIS — R262 Difficulty in walking, not elsewhere classified: Secondary | ICD-10-CM

## 2019-10-18 NOTE — Therapy (Signed)
[place] MAIN [facility] SERVICES [address] [place], [place], [address] Phone: [phone]   Fax:  [phone]  Physical Therapy Treatment  Patient Details  Name: Dwayne Robinson MRN: [id number] Date of Birth: [birth year] Referring Provider (PT): Jennings Books   Encounter Date: 10/18/2019  PT End of Session - 10/18/19 1156    Visit Number  3    Number of Visits  17    Date for PT Re-Evaluation  12/06/19    PT Start Time  9702    PT Stop Time  1225    PT Time Calculation (min)  40 min    Equipment Utilized During Treatment  Gait belt    Activity Tolerance  Patient tolerated treatment well    Behavior During Therapy  [facility] for tasks assessed/performed       Past Medical History:  Diagnosis Date  . A-fib ([place])   . Arthritis   . Bleeding disorder ([place])   . GERD (gastroesophageal reflux disease)   . Heart attack ([place])   . Heart disease   . Heart failure ([place])   . Heart murmur     Past Surgical History:  Procedure Laterality Date  . CARDIAC SURGERY    . CARDIAC VALVE SURGERY      There were no vitals filed for this visit.  Subjective Assessment - 10/18/19 1155    Subjective  Pateint has numbness in BLE and has balance loss.[name] He did his exercises over the weekend.    Pertinent History  Patient has had foot neuropathy for 15 years ago. He says that both feet are cold all the way to the knees. He does not use an AD. He has difficutly with steps and climbing up a ladder. He has had some falls several months ago.    Limitations  Standing    How long can you stand comfortably?  15 mins    How long can you walk comfortably?  15 mins    Patient Stated Goals  Patient wants to get better.    Currently in Pain?  No/denies    Pain Score  0-No pain    Pain Onset  More than a month ago      Treatment: Octane fitness x 5 mins L2        Neuromuscular Re-education  1/2 foam flat side up,  single leg, stool single leg and hold arms  straight out  x 2 mins Tandem gait  without UE support x 2 lengths Side stepping  without UE support x 2 lengths Heel/toe raises without UE support 3s hold x 10 each 1/2 foam roll balance with flat side up 30s x 2 reps 1/2 foam roll balance with flat side down 30s x 2 reps 1/2 foam roll tandem balance alternating forward LE 30s x 2 each LE forward Lateral side steps from foam to 6 inch stool left and right x 15 Backwards stepping from foam to 6 inch stool x 15  Leg press 40 lbs x 20 x 2 sets       Pt educated throughout session about proper posture and technique with exercises. Improved exercise technique, movement at target joints, use of target muscles after min to mod verbal, visual, tactile cues. CGA and Min to mod verbal cues used throughout with increased in postural sway and LOB most seen with narrow base of support and while on uneven surfaces. Continues to have balance deficits typical with diagnosis. Patient performs intermediate level exercises without pain  behaviors and needs verbal cuing for postural alignment and head positioning                        PT Education - 10/18/19 1156    Education Details  HEP    Person(s) Educated  Patient    Methods  Explanation    Comprehension  Verbalized understanding;Returned demonstration;Need further instruction       PT Short Term Goals - 10/11/19 1319      PT SHORT TERM GOAL #1   Title  Patient will be independent in home exercise program to improve strength/mobility for better functional independence with ADLs.    Time  8    Period  Weeks    Status  New    Target Date  11/08/19        PT Long Term Goals - 10/11/19 1320      PT LONG TERM GOAL #1   Title  Patient (> 1 years old) will complete five times sit to stand test in < 15 seconds indicating an increased LE strength and improved balance.    Time  8    Period  Weeks    Status  New    Target Date  12/05/19      PT LONG TERM GOAL #2   Title   Patient will increase Berg Balance score by > 6 points to demonstrate decreased fall risk during functional activities.    Time  8    Period  Weeks    Status  New    Target Date  12/06/19      PT LONG TERM GOAL #3   Title  Patient will increase six minute walk test distance to >1000 for progression to community ambulator and improve gait ability    Time  8    Period  Weeks    Status  New    Target Date  12/06/19      PT LONG TERM GOAL #4   Title  Patient will increase 10 meter walk test to >1.54m/s as to improve gait speed for better community ambulation and to reduce fall risk.    Time  8    Period  Weeks    Status  New    Target Date  12/06/19            Plan - 10/18/19 1157    Clinical Impression Statement  Patient instructed in beginning balance and coordination exercise. Patient required mod VCs and min A for gait to improve weight shift and postural control. Patient requires min VCs to improve ankle stability with increased balance challenges and gait.  Patients would benefit from additional skilled PT intervention to improve balance/gait safety and reduce fall risk.   Personal Factors and Comorbidities  Age    Examination-Activity Limitations  Research officer, political party Overhead    Examination-Participation Restrictions  Community Activity    Rehab Potential  Good    PT Frequency  2x / week    PT Duration  8 weeks    PT Treatment/Interventions  Manual techniques;Neuromuscular re-education;Balance training;Therapeutic exercise;Functional mobility training;Therapeutic activities    PT Next Visit Plan  balance training    PT Home Exercise Plan  patient arrived late    Consulted and Agree with Plan of Care  Patient       Patient will benefit from skilled therapeutic intervention in order to improve the following deficits and impairments:  Abnormal gait, Decreased strength, Difficulty walking, Decreased balance, Pain, Decreased  mobility  Visit Diagnosis: Difficulty in  walking, not elsewhere classified  Other abnormalities of gait and mobility  Muscle weakness (generalized)     Problem List Patient Active Problem List   Diagnosis Date Noted  . Renal mass 01/25/2019  . History of MI (myocardial infarction) 09/23/2018  . LVH (left ventricular hypertrophy) due to hypertensive disease, without heart failure 08/08/2018  . Dizziness 12/02/2017  . SOBOE (shortness of breath on exertion) 10/13/2017  . Atrial flutter, paroxysmal (HCC) 12/02/2016  . CAD (coronary artery disease) 10/17/2014  . Atherosclerosis of both carotid arteries 06/27/2014  . Benign essential HTN 06/27/2014  . Chronic systolic CHF (congestive heart failure), NYHA class 3 (HCC) 06/27/2014  . Mixed hyperlipidemia 06/27/2014  . Paroxysmal A-fib (HCC) 06/27/2014  . Severe mitral insufficiency 06/27/2014  . Elevated prostate specific antigen (PSA) 07/05/2012  . Enlarged prostate with lower urinary tract symptoms (LUTS) 07/05/2012  . Microscopic hematuria 07/05/2012    Ezekiel Ina, PT DPT 10/18/2019, 11:57 AM  [place] [facility] MAIN [facility] SERVICES [address] [place], [place], [address] Phone: [phone]   Fax:  [phone]  Name: Dwayne Robinson MRN: [id number] Date of Birth: [birth year]

## 2019-10-21 ENCOUNTER — Ambulatory Visit
Admission: RE | Admit: 2019-10-21 | Discharge: 2019-10-21 | Disposition: A | Discharge status: Home / Self Care | Payer: Medicare PPO | Source: Ambulatory Visit | Attending: Neurology | Admitting: Neurology

## 2019-10-21 DIAGNOSIS — G9389 Other specified disorders of brain: Secondary | ICD-10-CM | POA: Insufficient documentation

## 2019-10-21 IMAGING — MR MR HEAD W/ CM
4 series · 48 of 48 positions shown · IV contrast (10ml Gadavist)
Comparison: [DATE]

CLINICAL DATA: Midline lesion adjacent to the splenium of the
corpus callosum.

EXAM:
MRI HEAD WITH CONTRAST
TECHNIQUE: Multiplanar, multiecho pulse sequences of the brain and surrounding
structures were obtained with intravenous contrast.
CONTRAST:  10mL GADAVIST GADOBUTROL 1 MMOL/ML IV SOLN

[Series 5: T1 · axial · 1.0mm · 0.98mm/px · z∈[-52,+135]mm · 21 of 190 slices shown]
[im 1/190]
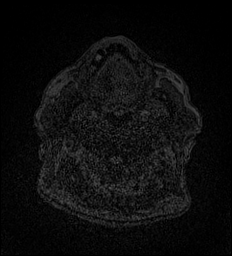
[im 10/190]
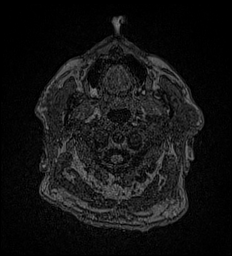
[im 19/190]
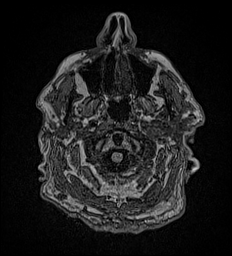
[im 29/190]
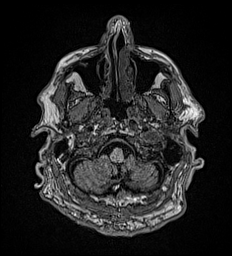
[im 38/190]
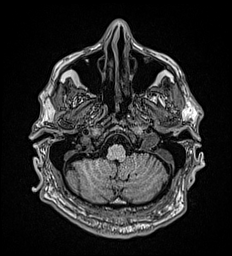
[im 48/190]
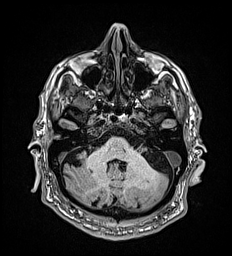
[im 57/190]
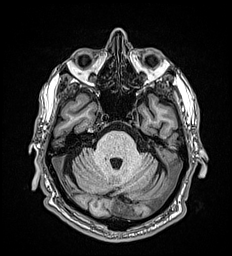
[im 67/190]
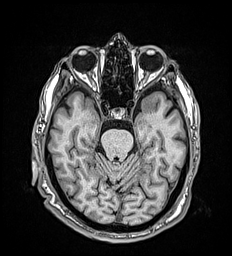
[im 76/190]
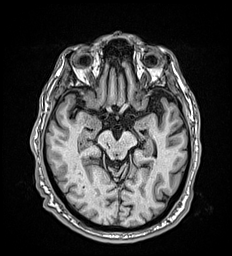
[im 86/190]
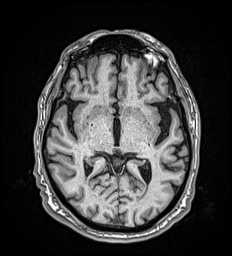
[im 95/190]
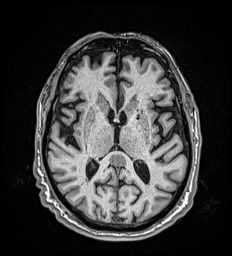
[im 104/190]
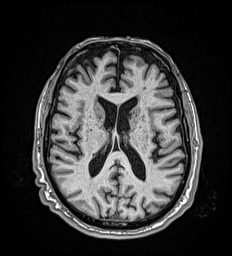
[im 114/190]
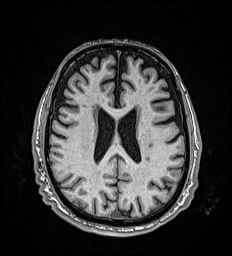
[im 123/190]
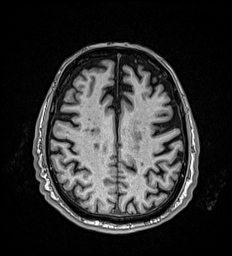
[im 133/190]
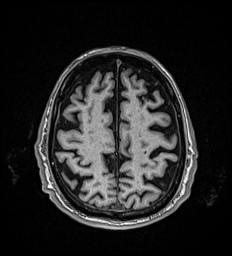
[im 142/190]
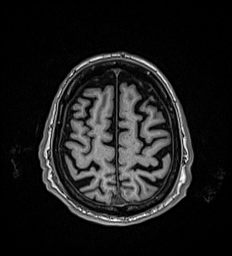
[im 152/190]
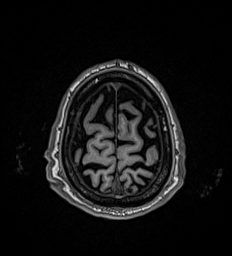
[im 161/190]
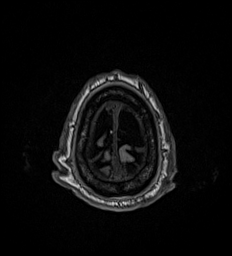
[im 171/190]
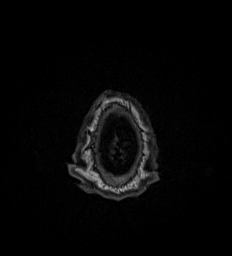
[im 180/190]
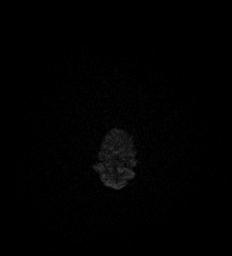
[im 190/190]
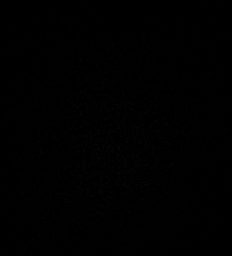

[Series 6: T1 post-contrast · axial · 1.0mm · 0.98mm/px · z∈[-52,+135]mm · 21 of 190 slices shown (1 of 3)]
[im 1/190]
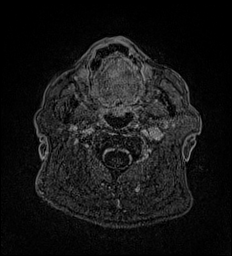
[im 10/190]
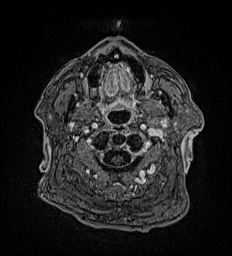
[im 19/190]
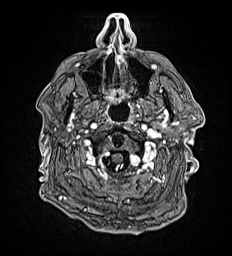
[im 29/190]
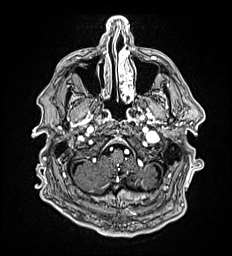
[im 38/190]
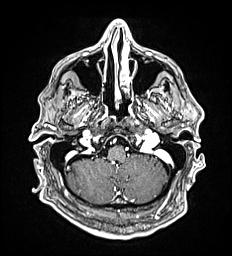
[im 48/190]
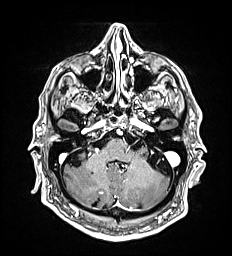
[im 57/190]
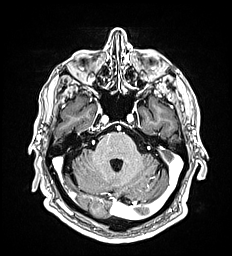
[im 67/190]
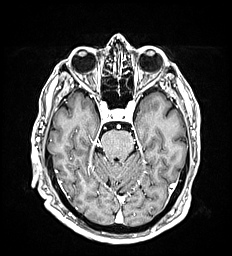
[im 76/190]
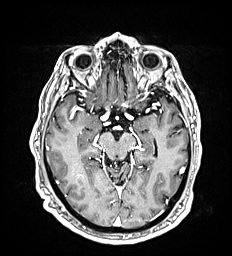
[im 86/190]
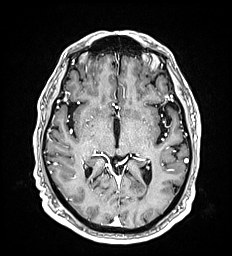
[im 95/190]
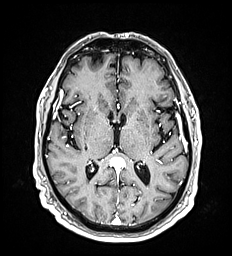
[im 104/190]
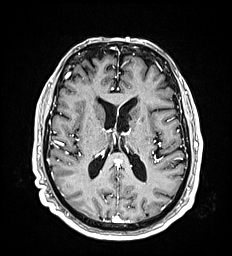
[im 114/190]
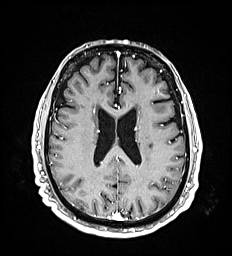
[im 123/190]
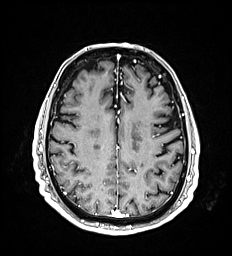
[im 133/190]
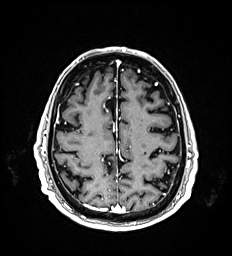
[im 142/190]
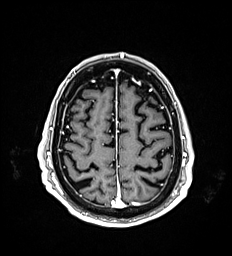
[im 152/190]
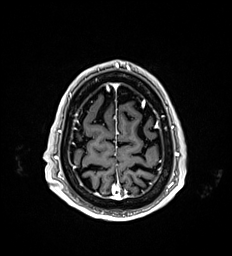
[im 161/190]
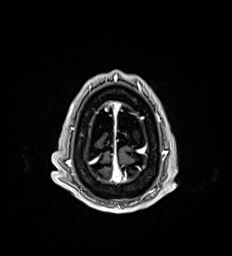
[im 171/190]
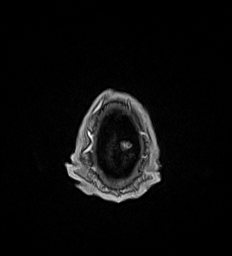
[im 180/190]
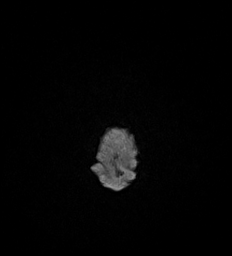
[im 190/190]
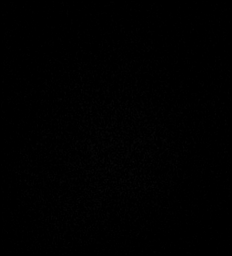

[Series 7: T1 post-contrast · coronal · 5.0mm · 0.57mm/px · 3 of 31 slices shown (2 of 3)]
[im 1/31]
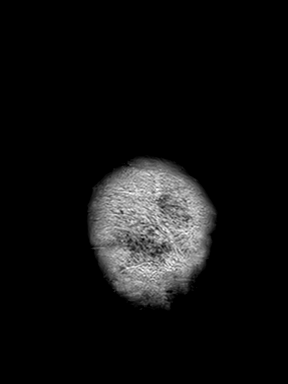
[im 16/31]
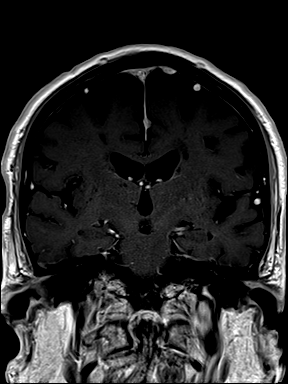
[im 31/31]
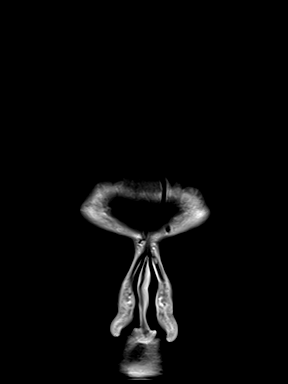

[Series 8: T1 post-contrast · sagittal · 5.0mm · 0.62mm/px · 3 of 23 slices shown (3 of 3)]
[im 1/23]
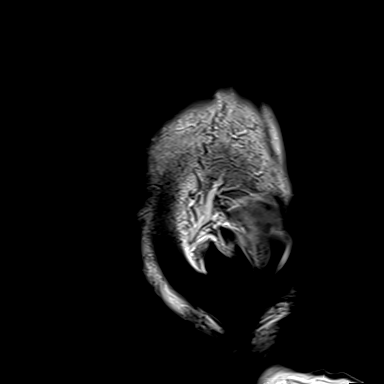
[im 12/23]
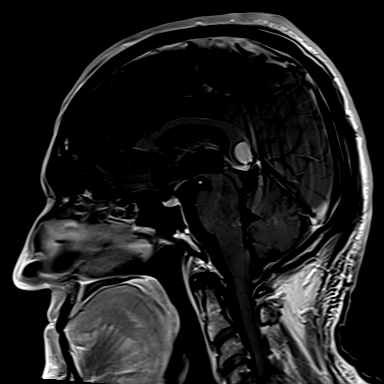
[im 23/23]
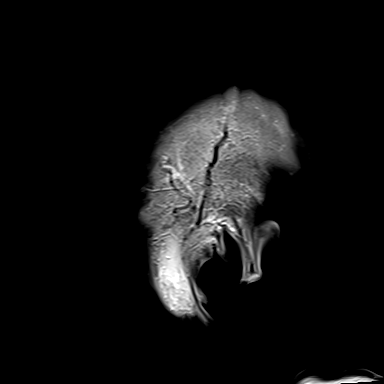

[48 of 48 positions shown; findings below may reference images not displayed]

FINDINGS: Brain: Postcontrast imaging shows uniform enhancement of the round
mass at the apex the junction of the falx and tentorium measuring 11
x 15 x 13 mm. The lesion indents the splenium the corpus callosum.
This is consistent with a meningioma. Minimal T2 and FLAIR signal
within the splenium adjacent to this.

Additionally, there is a 4 mm meningioma of the falx in the
posterior frontal region.

No brain parenchymal enhancement occurs.

Vascular: No abnormal vascular structure.

Skull and upper cervical spine: Negative

Sinuses/Orbits: Clear/normal

Other: None
IMPRESSION: 11 x 15 x 13 mm extra-axial lesion consistent with meningioma
arising from the apex of the junction of the tentorium and falx.
Indentation of the splenium of the corpus callosum.

Second 4 mm meningioma of the falx.

## 2019-10-21 MED ORDER — GADOBUTROL 1 MMOL/ML IV SOLN
10.0000 mL | Freq: Once | INTRAVENOUS | Status: AC | PRN
  Administered 2019-10-21: 10 mL via INTRAVENOUS

## 2019-10-24 ENCOUNTER — Ambulatory Visit: Payer: Medicare PPO | Admitting: Physical Therapy

## 2019-10-24 ENCOUNTER — Encounter: Payer: Self-pay | Admitting: Physical Therapy

## 2019-10-24 ENCOUNTER — Other Ambulatory Visit: Payer: Self-pay

## 2019-10-24 DIAGNOSIS — R2689 Other abnormalities of gait and mobility: Secondary | ICD-10-CM

## 2019-10-24 DIAGNOSIS — M6281 Muscle weakness (generalized): Secondary | ICD-10-CM

## 2019-10-24 DIAGNOSIS — R262 Difficulty in walking, not elsewhere classified: Secondary | ICD-10-CM

## 2019-10-24 NOTE — Therapy (Signed)
[place] MAIN [facility] SERVICES [address] [place], [place], [address] Phone: [phone]   Fax:  [phone]  Physical Therapy Treatment  Patient Details  Name: Dwayne Robinson MRN: [id number] Date of Birth: 10-Apr-1936 Referring Provider (PT): Jennings Books   Encounter Date: 10/24/2019  PT End of Session - 10/24/19 1109    Visit Number  4    Number of Visits  17    Date for PT Re-Evaluation  12/06/19    PT Start Time  1104    PT Stop Time  1145    PT Time Calculation (min)  41 min    Equipment Utilized During Treatment  Gait belt    Activity Tolerance  Patient tolerated treatment well    Behavior During Therapy  [facility] for tasks assessed/performed       Past Medical History:  Diagnosis Date  . A-fib ([place])   . Arthritis   . Bleeding disorder ([place])   . GERD (gastroesophageal reflux disease)   . Heart attack ([place])   . Heart disease   . Heart failure ([place])   . Heart murmur     Past Surgical History:  Procedure Laterality Date  . CARDIAC SURGERY    . CARDIAC VALVE SURGERY      There were no vitals filed for this visit.  Subjective Assessment - 10/24/19 1107    Subjective  Pateint has numbness in BLE and has balance loss.[name] He did his exercises over the weekend.    Pertinent History  Patient has had foot neuropathy for 15 years ago. He says that both feet are cold all the way to the knees. He does not use an AD. He has difficutly with steps and climbing up a ladder. He has had some falls several months ago.    Limitations  Standing    How long can you stand comfortably?  15 mins    How long can you walk comfortably?  15 mins    Patient Stated Goals  Patient wants to get better.    Currently in Pain?  No/denies    Pain Score  0-No pain    Pain Onset  More than a month ago         Treatment:   Ther-ex Octane fitness x 5 mins  Hip flexion marches with 3# ankle weights x 10 bilateral Hip extension with 3# x 10  bilateral Hip abd with 3# x 10 bilateral Lunges to BOSU ball x 15 BLE, 3 lbs  Heel raises, 2 second hold  x 15 x 2 sets Step over hurdle with 3 lbs  x 20    Neuromuscular training 1/2 foam heel / toe rock flat side up x 2 mins  1/2 foam tandem flat side down  x 2 mins  Standing on flat surface tandem with head turns x 2 min, difficult to turn head  Matrix , 22. 5 lbs fwd /bwd x 3 reps, side steps left and right x 2 reps      Pt educated throughout session about proper posture and technique with exercises. Improved exercise technique, movement at target joints, use of target muscles after min to mod verbal, visual, tactile cues                       PT Education - 10/24/19 1108    Education Details  HEP    Person(s) Educated  Patient    Methods  Explanation    Comprehension  Verbalized understanding;Need further instruction       PT Short Term Goals - 10/11/19 1319      PT SHORT TERM GOAL #1   Title  Patient will be independent in home exercise program to improve strength/mobility for better functional independence with ADLs.    Time  8    Period  Weeks    Status  New    Target Date  11/08/19        PT Long Term Goals - 10/11/19 1320      PT LONG TERM GOAL #1   Title  Patient (> 36 years old) will complete five times sit to stand test in < 15 seconds indicating an increased LE strength and improved balance.    Time  8    Period  Weeks    Status  New    Target Date  12/05/19      PT LONG TERM GOAL #2   Title  Patient will increase Berg Balance score by > 6 points to demonstrate decreased fall risk during functional activities.    Time  8    Period  Weeks    Status  New    Target Date  12/06/19      PT LONG TERM GOAL #3   Title  Patient will increase six minute walk test distance to >1000 for progression to community ambulator and improve gait ability    Time  8    Period  Weeks    Status  New    Target Date  12/06/19      PT LONG TERM  GOAL #4   Title  Patient will increase 10 meter walk test to >1.89m/s as to improve gait speed for better community ambulation and to reduce fall risk.    Time  8    Period  Weeks    Status  New    Target Date  12/06/19            Plan - 10/24/19 1109    Clinical Impression Statement  Patient demonstrates deficits with postural control in tandem and narrow stance on purple foam. Patient demonstrated hesitation with full weight shifting and on uneven surfaces but minimal cueing resulted in good technique and no LOB.  Patient improved ability to challenge dynamic balance with supervision and with UE assist today.   Patient will continue to benefit from skilled physical therapy to improve endurance and dynamic balance to reduce fall risk    Personal Factors and Comorbidities  Age    Examination-Activity Limitations  Locomotion Level;Reach Overhead    Examination-Participation Restrictions  Community Activity    Rehab Potential  Good    PT Frequency  2x / week    PT Duration  8 weeks    PT Treatment/Interventions  Manual techniques;Neuromuscular re-education;Balance training;Therapeutic exercise;Functional mobility training;Therapeutic activities    PT Next Visit Plan  balance training    PT Home Exercise Plan  patient arrived late    Consulted and Agree with Plan of Care  Patient       Patient will benefit from skilled therapeutic intervention in order to improve the following deficits and impairments:  Abnormal gait, Decreased strength, Difficulty walking, Decreased balance, Pain, Decreased mobility  Visit Diagnosis: Difficulty in walking, not elsewhere classified  Other abnormalities of gait and mobility  Muscle weakness (generalized)     Problem List Patient Active Problem List   Diagnosis Date Noted  . Renal mass 01/25/2019  . History of MI (myocardial infarction)  09/23/2018  . LVH (left ventricular hypertrophy) due to hypertensive disease, without heart failure  08/08/2018  . Dizziness 12/02/2017  . SOBOE (shortness of breath on exertion) 10/13/2017  . Atrial flutter, paroxysmal (HCC) 12/02/2016  . CAD (coronary artery disease) 10/17/2014  . Atherosclerosis of both carotid arteries 06/27/2014  . Benign essential HTN 06/27/2014  . Chronic systolic CHF (congestive heart failure), NYHA class 3 (HCC) 06/27/2014  . Mixed hyperlipidemia 06/27/2014  . Paroxysmal A-fib (HCC) 06/27/2014  . Severe mitral insufficiency 06/27/2014  . Elevated prostate specific antigen (PSA) 07/05/2012  . Enlarged prostate with lower urinary tract symptoms (LUTS) 07/05/2012  . Microscopic hematuria 07/05/2012    Ezekiel Ina, PT DPT 10/24/2019, 11:16 AM  [place] [facility] MAIN [facility] SERVICES [address] [place], [place], [address] Phone: [phone]   Fax:  [phone]  Name: Dashan Chizmar MRN: [id number] Date of Birth: 1936-04-05

## 2019-10-26 ENCOUNTER — Ambulatory Visit: Payer: Medicare PPO | Admitting: Speech Pathology

## 2019-10-30 ENCOUNTER — Encounter: Payer: Self-pay | Admitting: Speech Pathology

## 2019-10-30 ENCOUNTER — Other Ambulatory Visit: Payer: Self-pay

## 2019-10-30 ENCOUNTER — Ambulatory Visit: Payer: Medicare PPO | Admitting: Speech Pathology

## 2019-10-30 DIAGNOSIS — M6281 Muscle weakness (generalized): Secondary | ICD-10-CM | POA: Diagnosis not present

## 2019-10-30 DIAGNOSIS — R41841 Cognitive communication deficit: Secondary | ICD-10-CM

## 2019-10-30 NOTE — Therapy (Signed)
[facility] MAIN [facility] SERVICES [address] [place], [place], [address] Phone: [phone]   Fax:  [phone]  Speech Language Pathology Evaluation  Patient Details  Name: Dwayne Robinson MRN: [id number] Date of Birth: [birth year] No data recorded  Encounter Date: 10/30/2019  End of Session - 10/30/19 1336    Visit Number  1    Number of Visits  17    Date for SLP Re-Evaluation  12/28/19    SLP Start Time  1104    SLP Stop Time   1200    SLP Time Calculation (min)  56 min    Activity Tolerance  Patient tolerated treatment well       Past Medical History:  Diagnosis Date  . A-fib (HCC)   . Arthritis   . Bleeding disorder (HCC)   . GERD (gastroesophageal reflux disease)   . Heart attack (HCC)   . Heart disease   . Heart failure (HCC)   . Heart murmur     Past Surgical History:  Procedure Laterality Date  . CARDIAC SURGERY    . CARDIAC VALVE SURGERY      There were no vitals filed for this visit.  Subjective Assessment - 10/30/19 1109    Currently in Pain?  No/denies         SLP Evaluation [facility] - 10/30/19 1109      General Information   HPI  This 84 y/o pt of Dr. Margaretmary Eddy is here for evaluation of increasing difficulty with his memory. Dr. 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 Per Dr. Margaretmary Eddy notes, "pt reports he has had gradual onset of issues w/his cognition since around 2019 or so. He no longer drives. He has difficulty w/following conversations, difficulty w/confusion between day and night. He sleeps a lot during the day. He has shown personality changes such as increased irritability. He has difficulty repairing things, and he cannot remember where his wife is after she tells him." Pt is here seeking assistance for increasing difficulty with his memory.     Balance Screen   Has the patient  fallen in the past 6 months  No    Has the patient had a decrease in activity level because of a fear of falling?   No    Is the patient reluctant to leave their home because of a fear of falling?   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, Social research officer, government   Overall Cognitive Status  No family/caregiver present to determine baseline cognitive functioning    Attention  Selective;Alternating;Divided    Selective Attention  Impaired    Alternating Attention  Impaired    Divided Attention  Impaired    Memory  Impaired    Memory Impairment  Storage deficit;Retrieval deficit;Decreased recall of new information;Decreased short term memory    Awareness  Appears intact    Problem Solving  Appears intact    Executive Function  Organizing;Self Monitoring    Organizing  Impaired    Self Monitoring  Impaired      Auditory Comprehension   Overall Auditory Comprehension  Impaired  Yes/No Questions  Within Functional Limits    Commands  Within Functional Limits    Conversation  Moderately complex    Interfering Components  Attention;Visual impairments;Hearing;Working Designer, television/film set time;Increased volume;Pausing;Repetition;Slowed speech;Stressing words;Visual/Gestural cues      Visual Recognition/Discrimination   Discrimination  Within Function Limits      Reading Comprehension   Reading Status  Impaired    Paragraph Level  76-100% accurate    Interfering Components  Attention;Working Landscape architect cueing      Expression   Primary Mode of Expression  Verbal      Verbal Expression   Overall Verbal Expression  Appears within functional limits for tasks assessed      Written Expression   Dominant Hand  Right    Written Expression  Within Functional Limits      Oral  Motor/Sensory Function   Overall Oral Motor/Sensory Function  Appears within functional limits for tasks assessed      Motor Speech   Overall Motor Speech  Appears within functional limits for tasks assessed      Standardized Assessments   Standardized Assessments   Montreal Cognitive Assessment (MOCA);Other Assessment    Montreal Cognitive Assessment (MOCA)   19/25 (mild cognitive impairment)    Other Assessment  WAB (Revised) Bedside Language Score= 93.75       MOCA, version 8.2   Visuospatial/Executive Functioning:         4/5             Alternating Trail Making: 0/1             Visuoconstruction Skills: 1/1             Draw a clock: 3/3 Naming:                                                          3/3  Attention:                                                       4/6             Forward digit span, 5 digits: 1/1             Reverse digit span, 3 digits: 1/1             Vigilance: 1/1             Serial 7's: 1/3 Language:                                                      3/3             Verbal Fluency 1/1             Repetition: 2/2 Abstraction:  0/2 Delayed Recall:                                              0/5  Memory Index Score: 5/15 Orientation:                                                    5/6   TOTAL      19/30= Mild cognitive impairment (18-25)    Western Aphasia Battery-Revised Bedside  Spontaneous Speech    9/10 Auditory Verbal Comprehension  Y/N     10/10  Sequential Commands  10/10  Repetition    9/10 Object Naming    10/10  Bedside Aphasia Score   96.6  Reading     8/10 Writing      9/10  Bedside Language Score   93.75     SLP Education - 10/30/19 1335    Education Details  re: role of SLP in cognitive training    Person(s) Educated  Patient    Methods  Explanation;Verbal cues    Comprehension  Verbalized understanding         SLP Long Term Goals - 10/30/19 1337       SLP LONG TERM GOAL #1   Title  Pt will complete auditory and visual attention/vigilance/memory tasks with 80% accuracy given min cues.    Time  8    Period  Weeks    Status  New    Target Date  12/28/19      SLP LONG TERM GOAL #2   Title  Pt will use external memory aids and compensatory strategies to aid short term recall with 80% accuracy.    Time  8    Period  Weeks    Status  New    Target Date  12/28/19      SLP LONG TERM GOAL #3   Title  Pt will complete cognitive linguistic organization and retrieval tasks with 80% accuracy.    Time  8    Period  Weeks    Status  New    Target Date  12/28/19      SLP LONG TERM GOAL #4   Title  Pt will recall facts, answer wh questions following reading functional reading materials (ie menus, receipts, advertisements, etc)  and moderately complex paragraphs with 80% accuracy.    Time  8    Period  Weeks    Status  New    Target Date  12/28/19       Plan - 10/30/19 1336    Clinical Impression Statement  This very pleasant 84 y/o male presents with mild to mod cognitive linguistic impairment c/b deficits w/short term recall, working memory, Animator & flexibility, and attention. Relative strengths include auditory comprehension and expressive language skills. Spontaneous speech is clear, fluent, and composed of cogent mod complex sentences.Pt will benefit from skilled SLP tx for cognitive training to promote restoration of cognitive linguistic skills  & provide pt education re: compensatory strategies to improve safety and functional independence.    Speech Therapy Frequency  2x / week    Duration  Other (comment)   8 weeks   Treatment/Interventions  Cognitive reorganization;SLP instruction and feedback;Compensatory strategies;Functional tasks;Patient/family education;Internal/external  aids;Cueing hierarchy    Potential to Achieve Goals  Good    Potential Considerations  Ability to learn/carryover information;Family/community  support;Previous level of function;Cooperation/participation level    SLP Home Exercise Plan  TBD    Consulted and Agree with Plan of Care  Patient       Patient will benefit from skilled therapeutic intervention in order to improve the following deficits and impairments:   Cognitive communication deficit    Problem List Patient Active Problem List   Diagnosis Date Noted  . Renal mass 01/25/2019  . History of MI (myocardial infarction) 09/23/2018  . LVH (left ventricular hypertrophy) due to hypertensive disease, without heart failure 08/08/2018  . Dizziness 12/02/2017  . SOBOE (shortness of breath on exertion) 10/13/2017  . Atrial flutter, paroxysmal (HCC) 12/02/2016  . CAD (coronary artery disease) 10/17/2014  . Atherosclerosis of both carotid arteries 06/27/2014  . Benign essential HTN 06/27/2014  . Chronic systolic CHF (congestive heart failure), NYHA class 3 (HCC) 06/27/2014  . Mixed hyperlipidemia 06/27/2014  . Paroxysmal A-fib (HCC) 06/27/2014  . Severe mitral insufficiency 06/27/2014  . Elevated prostate specific antigen (PSA) 07/05/2012  . Enlarged prostate with lower urinary tract symptoms (LUTS) 07/05/2012  . Microscopic hematuria 07/05/2012    Jeannette How, MA, CCC-SLP 10/30/2019, 1:54 PM  [facility] MAIN [facility] SERVICES [address] [place], [place], [address] Phone: [phone]   Fax:  [phone]  Name: Dwayne Robinson MRN: [id number] Date of Birth: 1935-11-23

## 2019-11-02 ENCOUNTER — Encounter: Payer: Self-pay | Admitting: Speech Pathology

## 2019-11-02 ENCOUNTER — Ambulatory Visit: Payer: Medicare PPO | Admitting: Speech Pathology

## 2019-11-02 ENCOUNTER — Other Ambulatory Visit: Payer: Self-pay

## 2019-11-02 DIAGNOSIS — R41841 Cognitive communication deficit: Secondary | ICD-10-CM

## 2019-11-02 DIAGNOSIS — M6281 Muscle weakness (generalized): Secondary | ICD-10-CM | POA: Diagnosis not present

## 2019-11-02 NOTE — Therapy (Signed)
[facility] MAIN [facility] SERVICES [address] [place], [place], [address] Phone: [phone]   Fax:  [phone]  Speech Language Pathology Treatment  Patient Details  Name: Dwayne Robinson MRN: [id number] Date of Birth: 01/05/1936 No data recorded  Encounter Date: 11/02/2019  End of Session - 11/02/19 1057    Visit Number  2    Number of Visits  17    Date for SLP Re-Evaluation  12/28/19    Authorization - Visit Number  1    Authorization - Number of Visits  10    SLP Start Time  1000    SLP Stop Time   1050    SLP Time Calculation (min)  50 min    Activity Tolerance  Patient tolerated treatment well       Past Medical History:  Diagnosis Date  . A-fib (HCC)   . Arthritis   . Bleeding disorder (HCC)   . GERD (gastroesophageal reflux disease)   . Heart attack (HCC)   . Heart disease   . Heart failure (HCC)   . Heart murmur     Past Surgical History:  Procedure Laterality Date  . CARDIAC SURGERY    . CARDIAC VALVE SURGERY      There were no vitals filed for this visit.  Subjective Assessment - 11/02/19 1004    Subjective  Pt appeared to enjoy tx activities, stating several times, "This is fun!."    Currently in Pain?  No/denies            ADULT SLP TREATMENT - 11/02/19 0001      General Information   Behavior/Cognition  Alert;Cooperative;Pleasant mood    HPI  This 84 y/o pt of Dr. Margaretmary Eddy is here for evaluation of increasing difficulty with his memory. Dr. 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 Per Dr. Margaretmary Eddy notes, "pt reports he has had gradual onset of issues w/his cognition since around 2019 or so. He no longer drives. He has difficulty w/following conversations, difficulty w/confusion between day and night. He sleeps a lot during the day. He has shown personality changes such as increased  irritability. He has difficulty repairing things, and he cannot remember where his wife is after she tells him." Pt is here seeking assistance for increasing difficulty with his memory.      Treatment Provided   Treatment provided  Cognitive-Linquistic      Pain Assessment   Pain Assessment  No/denies pain      Cognitive-Linquistic Treatment   Treatment focused on  Cognition;Patient/family/caregiver education    Skilled Treatment  Pt completed divided visual attention tasks with 100% accuracy given min verbal cues. Patient performed working memory and cognitive flexibility tasks given semi-regular min to mod verbal cues for repetition and visualization with 80% accuracy.  Pt used acronyms to recall lists of information with 80% accuracy given min cues. Pt performed concrete and abstract convergent naming tasks given category members with 90% accuracy given intermittent min verbal cues. Educated pt re: compensatory strategies to aid short term recall: repetition, visualization, association, grouping, writing it down. Pt stated understanding.      Assessment / Recommendations / Plan   Plan  Continue with current plan of care      Progression Toward Goals   Progression toward goals  Progressing toward goals       SLP Education - 11/02/19 1055  Education Details  re: compensatory strategies to aid short term memory    Person(s) Educated  Patient    Methods  Explanation;Demonstration;Verbal cues;Handout    Comprehension  Verbalized understanding;Need further instruction;Returned demonstration;Verbal cues required         SLP Long Term Goals - 10/30/19 1337      SLP LONG TERM GOAL #1   Title  Pt will complete auditory and visual attention/vigilance/memory tasks with 80% accuracy given min cues.    Time  8    Period  Weeks    Status  New    Target Date  12/28/19      SLP LONG TERM GOAL #2   Title  Pt will use external memory aids and compensatory strategies to aid short term  recall with 80% accuracy.    Time  8    Period  Weeks    Status  New    Target Date  12/28/19      SLP LONG TERM GOAL #3   Title  Pt will complete cognitive linguistic organization and retrieval tasks with 80% accuracy.    Time  8    Period  Weeks    Status  New    Target Date  12/28/19      SLP LONG TERM GOAL #4   Title  Pt will recall facts, answer wh questions following reading functional reading materials (ie menus, receipts, advertisements, etc)  and moderately complex paragraphs with 80% accuracy.    Time  8    Period  Weeks    Status  New    Target Date  12/28/19       Plan - 11/02/19 1433    Clinical Impression Statement  Pt demonstrated improved independence and accuracy w/use of acronyms and convergent naming as trials progressed. However, noted fatigue w/working memory & cognitive flexibility as trials progressed. Home practice materials provided. Pt will continue to benefit from skilled ST tx for cognitive training to improve short term memory skills and provide pt education re: compensatory strategies to improve functional independence.   Speech Therapy Frequency  2x / week    Duration  Other (comment)   8 weeks   Treatment/Interventions  Cognitive reorganization;SLP instruction and feedback;Compensatory strategies;Functional tasks;Patient/family education;Internal/external aids;Cueing hierarchy    Potential to Achieve Goals  Good    Potential Considerations  Ability to learn/carryover information;Family/community support;Previous level of function;Cooperation/participation level    SLP Home Exercise Plan  Working memory, cognitive flexibility exercises    Consulted and Agree with Plan of Care  Patient       Patient will benefit from skilled therapeutic intervention in order to improve the following deficits and impairments:   Cognitive communication deficit    Problem List Patient Active Problem List   Diagnosis Date Noted  . Renal mass 01/25/2019  . History  of MI (myocardial infarction) 09/23/2018  . LVH (left ventricular hypertrophy) due to hypertensive disease, without heart failure 08/08/2018  . Dizziness 12/02/2017  . SOBOE (shortness of breath on exertion) 10/13/2017  . Atrial flutter, paroxysmal (HCC) 12/02/2016  . CAD (coronary artery disease) 10/17/2014  . Atherosclerosis of both carotid arteries 06/27/2014  . Benign essential HTN 06/27/2014  . Chronic systolic CHF (congestive heart failure), NYHA class 3 (HCC) 06/27/2014  . Mixed hyperlipidemia 06/27/2014  . Paroxysmal A-fib (HCC) 06/27/2014  . Severe mitral insufficiency 06/27/2014  . Elevated prostate specific antigen (PSA) 07/05/2012  . Enlarged prostate with lower urinary tract symptoms (LUTS) 07/05/2012  . Microscopic hematuria 07/05/2012  [place], MA, CCC-SLP 11/02/2019, 2:50 PM  [place] MAIN [facility] SERVICES [address] [place], [place], [address] Phone: [phone]   Fax:  [phone]   Name: Zamere Pasternak MRN: [id number] Date of Birth: 1935-11-24

## 2019-11-06 ENCOUNTER — Encounter: Payer: Self-pay | Admitting: Speech Pathology

## 2019-11-06 ENCOUNTER — Ambulatory Visit: Payer: Medicare PPO | Attending: Neurology

## 2019-11-06 ENCOUNTER — Ambulatory Visit: Payer: Medicare PPO | Admitting: Speech Pathology

## 2019-11-06 ENCOUNTER — Other Ambulatory Visit: Payer: Self-pay

## 2019-11-06 DIAGNOSIS — R41841 Cognitive communication deficit: Secondary | ICD-10-CM

## 2019-11-06 DIAGNOSIS — M6281 Muscle weakness (generalized): Secondary | ICD-10-CM | POA: Diagnosis present

## 2019-11-06 DIAGNOSIS — R262 Difficulty in walking, not elsewhere classified: Secondary | ICD-10-CM | POA: Diagnosis present

## 2019-11-06 DIAGNOSIS — R2689 Other abnormalities of gait and mobility: Secondary | ICD-10-CM | POA: Diagnosis present

## 2019-11-06 NOTE — Therapy (Signed)
[facility] MAIN [facility] SERVICES [address] [place], [place], [address] Phone: [phone]   Fax:  [phone]  Speech Language Pathology Treatment  Patient Details  Name: Dwayne Robinson MRN: [id number] Date of Birth: August 13, 1936 No data recorded  Encounter Date: 11/06/2019  End of Session - 11/06/19 1313    Visit Number  3    Number of Visits  17    Date for SLP Re-Evaluation  12/28/19    Authorization - Visit Number  2    Authorization - Number of Visits  10    SLP Start Time  1000    SLP Stop Time   1048    SLP Time Calculation (min)  48 min    Activity Tolerance  Patient tolerated treatment well       Past Medical History:  Diagnosis Date  . A-fib (HCC)   . Arthritis   . Bleeding disorder (HCC)   . GERD (gastroesophageal reflux disease)   . Heart attack (HCC)   . Heart disease   . Heart failure (HCC)   . Heart murmur     Past Surgical History:  Procedure Laterality Date  . CARDIAC SURGERY    . CARDIAC VALVE SURGERY      There were no vitals filed for this visit.  Subjective Assessment - 11/06/19 0959    Subjective  Pt continues to state how much he is enjoying tx activities,  stating, "This is fun!."    Currently in Pain?  No/denies            ADULT SLP TREATMENT - 11/06/19 0001      General Information   Behavior/Cognition  Alert;Cooperative;Pleasant mood    HPI  This 84 y/o pt of Dr. Margaretmary Eddy is here for evaluation of increasing difficulty with his memory. Dr. 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 Per Dr. Margaretmary Eddy notes, "pt reports he has had gradual onset of issues w/his cognition since around 2019 or so. He no longer drives. He has difficulty w/following conversations, difficulty w/confusion between day and night. He sleeps a lot during the day. He has shown personality changes such as  increased irritability. He has difficulty repairing things, and he cannot remember where his wife is after she tells him." Pt is here seeking assistance for increasing difficulty with his memory.      Treatment Provided   Treatment provided  Cognitive-Linquistic      Pain Assessment   Pain Assessment  No/denies pain      Cognitive-Linquistic Treatment   Treatment focused on  Cognition;Patient/family/caregiver education    Skilled Treatment  Patient performed working memory and cognitive flexibility tasks given regular min to mod verbal cues for repetition and visualization with 85% accuracy.   Pt used acronyms to recall lists of information with 90% accuracy given min to mod cues. Pt required SLP to provide acronym.  Continued to educate pt re: compensatory strategies to aid short term recall: repetition, visualization, association, grouping, writing it down and demonstrated practical uses throughout therapeutic activities. Pt stated understanding.       Assessment / Recommendations / Plan   Plan  Continue with current plan of care      Progression Toward Goals   Progression toward goals  Progressing toward goals       SLP Education - 11/06/19 1313    Education Details  re: compensatory  strategies to aid short term memory    Person(s) Educated  Patient    Methods  Explanation;Demonstration;Verbal cues;Handout    Comprehension  Verbalized understanding;Need further instruction;Returned demonstration;Verbal cues required         SLP Long Term Goals - 10/30/19 1337      SLP LONG TERM GOAL #1   Title  Pt will complete auditory and visual attention/vigilance/memory tasks with 80% accuracy given min cues.    Time  8    Period  Weeks    Status  New    Target Date  12/28/19      SLP LONG TERM GOAL #2   Title  Pt will use external memory aids and compensatory strategies to aid short term recall with 80% accuracy.    Time  8    Period  Weeks    Status  New    Target Date   12/28/19      SLP LONG TERM GOAL #3   Title  Pt will complete cognitive linguistic organization and retrieval tasks with 80% accuracy.    Time  8    Period  Weeks    Status  New    Target Date  12/28/19      SLP LONG TERM GOAL #4   Title  Pt will recall facts, answer wh questions following reading functional reading materials (ie menus, receipts, advertisements, etc)  and moderately complex paragraphs with 80% accuracy.    Time  8    Period  Weeks    Status  New    Target Date  12/28/19       Plan - 11/06/19 1314    Clinical Impression Statement  Noted improved independence with recall of target items when given corresponding acronym. In addition, noted mild improvement in use of repetition and visualization to aid working memory tasks. Pt will continue to benefit from skilled ST tx for cognitive training to restore short term memory skills and provide pt education re: compensatory strategies to improve functional independence.   Speech Therapy Frequency  2x / week    Duration  Other (comment)   8 weeks   Treatment/Interventions  Cognitive reorganization;SLP instruction and feedback;Compensatory strategies;Functional tasks;Patient/family education;Internal/external aids;Cueing hierarchy    Potential to Achieve Goals  Good    Potential Considerations  Ability to learn/carryover information;Family/community support;Previous level of function;Cooperation/participation level    SLP Home Exercise Plan  Working memory, cognitive flexibility exercises    Consulted and Agree with Plan of Care  Patient       Patient will benefit from skilled therapeutic intervention in order to improve the following deficits and impairments:   Cognitive communication deficit    Problem List Patient Active Problem List   Diagnosis Date Noted  . Renal mass 01/25/2019  . History of MI (myocardial infarction) 09/23/2018  . LVH (left ventricular hypertrophy) due to hypertensive disease, without heart  failure 08/08/2018  . Dizziness 12/02/2017  . SOBOE (shortness of breath on exertion) 10/13/2017  . Atrial flutter, paroxysmal ([place]) 12/02/2016  . CAD (coronary artery disease) 10/17/2014  . Atherosclerosis of both carotid arteries 06/27/2014  . Benign essential HTN 06/27/2014  . Chronic systolic CHF (congestive heart failure), NYHA class 3 ([place]) 06/27/2014  . Mixed hyperlipidemia 06/27/2014  . Paroxysmal A-fib ([place]) 06/27/2014  . Severe mitral insufficiency 06/27/2014  . Elevated prostate specific antigen (PSA) 07/05/2012  . Enlarged prostate with lower urinary tract symptoms (LUTS) 07/05/2012  . Microscopic hematuria 07/05/2012    Maxie Debose, MA, CCC-SLP 11/06/2019,  1:29 PM  [facility] MAIN [facility] SERVICES [address] [place], [place], [address] Phone: [phone]   Fax:  [phone]   Name: Dwayne Robinson MRN: [id number] Date of Birth: April 11, 1936

## 2019-11-06 NOTE — Therapy (Signed)
[facility] MAIN [facility] SERVICES [address] [place], [place], [address] Phone: [phone]   Fax:  [phone]  Physical Therapy Treatment  Patient Details  Name: Dwayne Robinson MRN: [id number] Date of Birth: [birth year] Referring Provider (PT): Cristopher Peru   Encounter Date: 11/06/2019  PT End of Session - 11/06/19 0906    Visit Number  5    Number of Visits  17    Date for PT Re-Evaluation  12/06/19    PT Start Time  0900    PT Stop Time  0945    PT Time Calculation (min)  45 min    Equipment Utilized During Treatment  Gait belt    Activity Tolerance  Patient tolerated treatment well;No increased pain    Behavior During Therapy  WFL for tasks assessed/performed       Past Medical History:  Diagnosis Date  . A-fib (HCC)   . Arthritis   . Bleeding disorder (HCC)   . GERD (gastroesophageal reflux disease)   . Heart attack (HCC)   . Heart disease   . Heart failure (HCC)   . Heart murmur     Past Surgical History:  Procedure Laterality Date  . CARDIAC SURGERY    . CARDIAC VALVE SURGERY      There were no vitals filed for this visit.  Subjective Assessment - 11/06/19 0905    Subjective  Pt doing well today, reports a slow weekend. Says he got his second COVID vaccine without complication or adverse effect. No recent falls, minimal HEP compliance.    Pertinent History  Patient has had foot neuropathy for 15 years ago. He says that both feet are cold all the way to the knees. He does not use an AD. He has difficutly with steps and climbing up a ladder. He has had some falls several months ago.    Currently in Pain?  No/denies      INTERVENTION THIS DATE:   Therapeutic Exercise: Nustep L2 x 4 min;  standing LE strengthening ex's in // bars as follows:  (all with 3# ankle wts B) LAQ, heel raises 2x15; hip abd 2x10 B; sit to stands, hands free Chair+dynadisc 2x10; alt marching 2x10 B.  *multimodal cues for correct form.   &^AW progressed from 2-3lb this date   NMR: Tandem stance on line in // bars with intermittent UE support PRN 3x30secB; normal stance eyes closed 5x30sec MinGuardA (no LOB), narrow BOS/feet together on blue Airex pad without UE support velcro darts ball throw 12x fwd, Rt 45*, Lt 45*; narrow stance ball toss.          PT Short Term Goals - 10/11/19 1319      PT SHORT TERM GOAL #1   Title  Patient will be independent in home exercise program to improve strength/mobility for better functional independence with ADLs.    Time  8    Period  Weeks    Status  New    Target Date  11/08/19        PT Long Term Goals - 10/11/19 1320      PT LONG TERM GOAL #1   Title  Patient (> 5 years old) will complete five times sit to stand test in < 15 seconds indicating an increased LE strength and improved balance.    Time  8    Period  Weeks    Status  New    Target Date  12/05/19      PT  LONG TERM GOAL #2   Title  Patient will increase Berg Balance score by > 6 points to demonstrate decreased fall risk during functional activities.    Time  8    Period  Weeks    Status  New    Target Date  12/06/19      PT LONG TERM GOAL #3   Title  Patient will increase six minute walk test distance to >1000 for progression to community ambulator and improve gait ability    Time  8    Period  Weeks    Status  New    Target Date  12/06/19      PT LONG TERM GOAL #4   Title  Patient will increase 10 meter walk test to >1.23m/s as to improve gait speed for better community ambulation and to reduce fall risk.    Time  8    Period  Weeks    Status  New    Target Date  12/06/19            Plan - 11/06/19 0906    Clinical Impression Statement Pt able to complete entire session as planned with rest breaks provided as needed. Pt maintains high level of focus and motivation. AW progressed from from 2 to 3lb without difficulty. Also progressed mini squats to STS from elevated surface. Extensive verbal,  visual, and tactile cues are provided for most accurate form possible. Author provides minA intermittently for full ROM when needed, mostly for exercise cues. Overall pt continues to make steady progress toward treatment goals.    Rehab Potential  Good    PT Frequency  2x / week    PT Duration  8 weeks    PT Treatment/Interventions  Manual techniques;Neuromuscular re-education;Balance training;Therapeutic exercise;Functional mobility training;Therapeutic activities    PT Next Visit Plan  balance training    PT Home Exercise Plan  No updates this vsisit; encourage compliance    Consulted and Agree with Plan of Care  Patient       Patient will benefit from skilled therapeutic intervention in order to improve the following deficits and impairments:  Abnormal gait, Decreased strength, Difficulty walking, Decreased balance, Pain, Decreased mobility  Visit Diagnosis: Cognitive communication deficit  Difficulty in walking, not elsewhere classified  Other abnormalities of gait and mobility  Muscle weakness (generalized)     Problem List Patient Active Problem List   Diagnosis Date Noted  . Renal mass 01/25/2019  . History of MI (myocardial infarction) 09/23/2018  . LVH (left ventricular hypertrophy) due to hypertensive disease, without heart failure 08/08/2018  . Dizziness 12/02/2017  . SOBOE (shortness of breath on exertion) 10/13/2017  . Atrial flutter, paroxysmal (HCC) 12/02/2016  . CAD (coronary artery disease) 10/17/2014  . Atherosclerosis of both carotid arteries 06/27/2014  . Benign essential HTN 06/27/2014  . Chronic systolic CHF (congestive heart failure), NYHA class 3 (HCC) 06/27/2014  . Mixed hyperlipidemia 06/27/2014  . Paroxysmal A-fib (HCC) 06/27/2014  . Severe mitral insufficiency 06/27/2014  . Elevated prostate specific antigen (PSA) 07/05/2012  . Enlarged prostate with lower urinary tract symptoms (LUTS) 07/05/2012  . Microscopic hematuria 07/05/2012   9:17 AM,  11/06/19 Rosamaria Lints, PT, DPT Physical Therapist - [facility]  Outpatient Physical Therapy- Main Campus [phone]     Rosamaria Lints 11/06/2019, 9:08 AM  [facility] MAIN [facility] SERVICES [address] [place], [place], [address] Phone: [phone]   Fax:  [phone]  Name:  Dwayne Robinson MRN: [id number] Date of Birth: 04-Sep-1936

## 2019-11-09 ENCOUNTER — Other Ambulatory Visit: Payer: Self-pay

## 2019-11-09 ENCOUNTER — Encounter: Payer: Self-pay | Admitting: Speech Pathology

## 2019-11-09 ENCOUNTER — Ambulatory Visit: Payer: Medicare PPO

## 2019-11-09 ENCOUNTER — Ambulatory Visit: Payer: Medicare PPO | Admitting: Speech Pathology

## 2019-11-09 DIAGNOSIS — R41841 Cognitive communication deficit: Secondary | ICD-10-CM

## 2019-11-09 DIAGNOSIS — M6281 Muscle weakness (generalized): Secondary | ICD-10-CM

## 2019-11-09 DIAGNOSIS — R262 Difficulty in walking, not elsewhere classified: Secondary | ICD-10-CM

## 2019-11-09 DIAGNOSIS — R2689 Other abnormalities of gait and mobility: Secondary | ICD-10-CM

## 2019-11-09 NOTE — Therapy (Signed)
[facility] MAIN [facility] SERVICES [address] [place], [place], [address] Phone: [phone]   Fax:  [phone]  Speech Language Pathology Treatment  Patient Details  Name: Dwayne Robinson MRN: [id number] Date of Birth: Aug 05, 1936 No data recorded  Encounter Date: 11/09/2019  End of Session - 11/09/19 1335    Visit Number  4    Number of Visits  17    Date for SLP Re-Evaluation  12/28/19    Authorization - Visit Number  3    Authorization - Number of Visits  10    SLP Start Time  1000    SLP Stop Time   1046    SLP Time Calculation (min)  46 min    Activity Tolerance  Patient tolerated treatment well;No increased pain;Patient limited by fatigue       Past Medical History:  Diagnosis Date  . A-fib (HCC)   . Arthritis   . Bleeding disorder (HCC)   . GERD (gastroesophageal reflux disease)   . Heart attack (HCC)   . Heart disease   . Heart failure (HCC)   . Heart murmur     Past Surgical History:  Procedure Laterality Date  . CARDIAC SURGERY    . CARDIAC VALVE SURGERY      There were no vitals filed for this visit.  Subjective Assessment - 11/09/19 0955    Subjective  Pt reported he is doing well today, but that he is "just tired" from his physical therapy session. He also reported he didn't sleep well last night.    Currently in Pain?  No/denies            ADULT SLP TREATMENT - 11/09/19 0001      General Information   Behavior/Cognition  Alert;Cooperative;Pleasant mood    HPI  This 84 y/o pt of Dr. Margaretmary Robinson is here for evaluation of increasing difficulty with his memory. Dr. 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 Per Dr. Margaretmary Robinson notes, "pt reports he has had gradual onset of issues w/his cognition since around 2019 or so. He no longer drives. He has difficulty w/following conversations, difficulty  w/confusion between day and night. He sleeps a lot during the day. He has shown personality changes such as increased irritability. He has difficulty repairing things, and he cannot remember where his wife is after she tells him." Pt is here seeking assistance for increasing difficulty with his memory.      Treatment Provided   Treatment provided  Cognitive-Linquistic      Pain Assessment   Pain Assessment  No/denies pain      Cognitive-Linquistic Treatment   Treatment focused on  Cognition;Patient/family/caregiver education    Skilled Treatment  Pt performed visual memory tasks given min verbal cues for strategies with 100% accuracy. Pt used compensatory strategies of repetition and visualization to answer wh- question re:1-2 sentence "stories" with 85% accuracy given repetition of target sentences x2.  Pt composed sentences to aid recall of word lists with 90% accuracy given min verbal cues. Pt used "memory pegs" to chunk information from paragraphs and used these "pegs" to recall the information with 90% accuracy given regular verbal cues for technique.        Assessment / Recommendations / Plan   Plan  Continue with current plan of care      Progression Toward Goals   Progression toward goals  Progressing toward goals       SLP Education - 11/09/19 1335    Education Details  re: compensatory strategies to aid short term memory, HEP    Person(s) Educated  Patient    Methods  Explanation;Demonstration;Verbal cues;Handout    Comprehension  Verbalized understanding;Need further instruction;Returned demonstration;Verbal cues required         SLP Long Term Goals - 10/30/19 1337      SLP LONG TERM GOAL #1   Title  Pt will complete auditory and visual attention/vigilance/memory tasks with 80% accuracy given min cues.    Time  8    Period  Weeks    Status  New    Target Date  12/28/19      SLP LONG TERM GOAL #2   Title  Pt will use external memory aids and compensatory  strategies to aid short term recall with 80% accuracy.    Time  8    Period  Weeks    Status  New    Target Date  12/28/19      SLP LONG TERM GOAL #3   Title  Pt will complete cognitive linguistic organization and retrieval tasks with 80% accuracy.    Time  8    Period  Weeks    Status  New    Target Date  12/28/19      SLP LONG TERM GOAL #4   Title  Pt will recall facts, answer wh questions following reading functional reading materials (ie menus, receipts, advertisements, etc)  and moderately complex paragraphs with 80% accuracy.    Time  8    Period  Weeks    Status  New    Target Date  12/28/19       Plan - 11/09/19 1335    Clinical Impression Statement  Pt required increased level of cueing this session to complete all short term recall tx tasks w/accuracy. Noted improved visual memory. Pt reported he did not have time to complete his HEP, due to having to fix a broken toilet; however reported he will try to complete his HEP before next session and will place his homework folder in visible location to help him to remember to bring it with him to his next session on Monday. Pt will continue to benefit from skilled ST tx for cognitive training to restore short term memory skills and provide pt education re: compensatory strategies to improve functional independence.   Speech Therapy Frequency  2x / week    Duration  Other (comment)   8 weeks   Treatment/Interventions  Cognitive reorganization;SLP instruction and feedback;Compensatory strategies;Functional tasks;Patient/family education;Internal/external aids;Cueing hierarchy    Potential to Achieve Goals  Good    Potential Considerations  Ability to learn/carryover information;Family/community support;Previous level of function;Cooperation/participation level    SLP Home Exercise Plan  Working memory, cognitive flexibility exercises    Consulted and Agree with Plan of Care  Patient       Patient will benefit from skilled  therapeutic intervention in order to improve the following deficits and impairments:   Cognitive communication deficit    Problem List Patient Active Problem List   Diagnosis Date Noted  . Renal mass 01/25/2019  . History of MI (myocardial infarction) 09/23/2018  . LVH (left ventricular hypertrophy) due to hypertensive disease, without heart failure 08/08/2018  . Dizziness 12/02/2017  . SOBOE (shortness of breath on exertion) 10/13/2017  . Atrial flutter, paroxysmal ([place]) 12/02/2016  . CAD (coronary artery disease) 10/17/2014  . Atherosclerosis  of both carotid arteries 06/27/2014  . Benign essential HTN 06/27/2014  . Chronic systolic CHF (congestive heart failure), NYHA class 3 (HCC) 06/27/2014  . Mixed hyperlipidemia 06/27/2014  . Paroxysmal A-fib (HCC) 06/27/2014  . Severe mitral insufficiency 06/27/2014  . Elevated prostate specific antigen (PSA) 07/05/2012  . Enlarged prostate with lower urinary tract symptoms (LUTS) 07/05/2012  . Microscopic hematuria 07/05/2012    Jeannette How, MA, CCC-SLP 11/09/2019, 1:51 PM  [facility] MAIN [facility] SERVICES [address] [place], [place], [address] Phone: [phone]   Fax:  [phone]   Name: Codie Krogh MRN: [id number] Date of Birth: 11/07/1935

## 2019-11-09 NOTE — Therapy (Addendum)
[facility] MAIN [facility] SERVICES [address] [place], [place], [address] Phone: [phone]   Fax:  [phone]  Physical Therapy Treatment  Patient Details  Name: Dwayne Robinson MRN: [id number] Date of Birth: 10-Dec-1935 Referring Provider (PT): Cristopher Peru   Encounter Date: 11/09/2019  PT End of Session - 11/09/19 0911    Visit Number  6    Number of Visits  17    Date for PT Re-Evaluation  12/06/19    PT Start Time  0907   arrived late   PT Stop Time  0945    PT Time Calculation (min)  38 min    Equipment Utilized During Treatment  Gait belt    Activity Tolerance  Patient tolerated treatment well;No increased pain;Patient limited by fatigue    Behavior During Therapy  [facility] for tasks assessed/performed       Past Medical History:  Diagnosis Date  . A-fib (HCC)   . Arthritis   . Bleeding disorder (HCC)   . GERD (gastroesophageal reflux disease)   . Heart attack (HCC)   . Heart disease   . Heart failure (HCC)   . Heart murmur     Past Surgical History:  Procedure Laterality Date  . CARDIAC SURGERY    . CARDIAC VALVE SURGERY      There were no vitals filed for this visit.  Subjective Assessment - 11/09/19 0911    Subjective  Pt doing well. A bit sore after last session. Also says he was up/down stairs and up/down from floor a bit working on a commode.    Pertinent History  Patient has had foot neuropathy for 15 years ago. He says that both feet are cold all the way to the knees. He does not use an AD. He has difficutly with steps and climbing up a ladder. He has had some falls several months ago.    Currently in Pain?  --   mod stiffness in bilat knes/thighs     Therapeutic Exercise: -Nustep L2-3 x 5 min; seat 12, arms 12 -STS from chair 1x10,hands free      NMR: -Matrix , 22. 5 lbs fwd /bwd x 2 reps each, side steps left and right x 2 reps each -Fwd step ups 1x10 each leg, unilateral dumbbell load 7lb  -standing  marching in place without UE support -lateral step-ups with silver physiobal BUE 1x6 bilat, intermittent minA for recovery -narrow stance airex biceps cable curl c triceps rope 1x15 12.5lb  -narrow stance airex cable row 1x25 @ 7.5 -standing cone taps c foot, no UE support 1x16, intermittent LOB requiring modA  *breaks provided as needed between interventions d/t fatigue; pt quite fatigued this date       PT Short Term Goals - 10/11/19 1319      PT SHORT TERM GOAL #1   Title  Patient will be independent in home exercise program to improve strength/mobility for better functional independence with ADLs.    Time  8    Period  Weeks    Status  New    Target Date  11/08/19        PT Long Term Goals - 10/11/19 1320      PT LONG TERM GOAL #1   Title  Patient (> 82 years old) will complete five times sit to stand test in < 15 seconds indicating an increased LE strength and improved balance.    Time  8    Period  Weeks  Status  New    Target Date  12/05/19      PT LONG TERM GOAL #2   Title  Patient will increase Berg Balance score by > 6 points to demonstrate decreased fall risk during functional activities.    Time  8    Period  Weeks    Status  New    Target Date  12/06/19      PT LONG TERM GOAL #3   Title  Patient will increase six minute walk test distance to >1000 for progression to community ambulator and improve gait ability    Time  8    Period  Weeks    Status  New    Target Date  12/06/19      PT LONG TERM GOAL #4   Title  Patient will increase 10 meter walk test to >1.53m/s as to improve gait speed for better community ambulation and to reduce fall risk.    Time  8    Period  Weeks    Status  New    Target Date  12/06/19            Plan - 11/09/19 0912    Clinical Impression Statement  Pt able to complete entire session as planned but with additional rest breaks provided as needed. Pt fatigues quickly in session. Pt maintains high level of focus and  motivation. Extensive verbal, visual, and tactile cues are provided for most accurate form possible. Author provides minA intermittently for full ROM when needed. Overall pt continues to make steady progress toward treatment goals.    Rehab Potential  Good    PT Frequency  2x / week    PT Duration  8 weeks    PT Treatment/Interventions  Manual techniques;Neuromuscular re-education;Balance training;Therapeutic exercise;Functional mobility training;Therapeutic activities    PT Next Visit Plan  Continue to progress balance training    PT Home Exercise Plan  No updates this vsisit; encourage compliance    Consulted and Agree with Plan of Care  Patient       Patient will benefit from skilled therapeutic intervention in order to improve the following deficits and impairments:  Abnormal gait, Decreased strength, Difficulty walking, Decreased balance, Pain, Decreased mobility  Visit Diagnosis: Difficulty in walking, not elsewhere classified  Other abnormalities of gait and mobility  Muscle weakness (generalized)     Problem List Patient Active Problem List   Diagnosis Date Noted  . Renal mass 01/25/2019  . History of MI (myocardial infarction) 09/23/2018  . LVH (left ventricular hypertrophy) due to hypertensive disease, without heart failure 08/08/2018  . Dizziness 12/02/2017  . SOBOE (shortness of breath on exertion) 10/13/2017  . Atrial flutter, paroxysmal (HCC) 12/02/2016  . CAD (coronary artery disease) 10/17/2014  . Atherosclerosis of both carotid arteries 06/27/2014  . Benign essential HTN 06/27/2014  . Chronic systolic CHF (congestive heart failure), NYHA class 3 (HCC) 06/27/2014  . Mixed hyperlipidemia 06/27/2014  . Paroxysmal A-fib (HCC) 06/27/2014  . Severe mitral insufficiency 06/27/2014  . Elevated prostate specific antigen (PSA) 07/05/2012  . Enlarged prostate with lower urinary tract symptoms (LUTS) 07/05/2012  . Microscopic hematuria 07/05/2012   9:43 AM,  11/09/19 Rosamaria Lints, PT, DPT Physical Therapist - [facility]  Outpatient Physical Therapy- Main Campus [phone]     Rosamaria Lints 11/09/2019, 9:14 AM  [facility] MAIN [facility] SERVICES [address] [place], [place], [address] Phone: [phone]   Fax:  [phone]  Name: Agam Davenport MRN: [id number] Date of Birth: [birth year]

## 2019-11-13 ENCOUNTER — Ambulatory Visit: Payer: Medicare PPO | Admitting: Speech Pathology

## 2019-11-13 ENCOUNTER — Encounter: Payer: Self-pay | Admitting: Speech Pathology

## 2019-11-13 ENCOUNTER — Ambulatory Visit: Payer: Medicare PPO | Admitting: Physical Therapy

## 2019-11-13 ENCOUNTER — Encounter: Payer: Self-pay | Admitting: Physical Therapy

## 2019-11-13 ENCOUNTER — Other Ambulatory Visit: Payer: Self-pay

## 2019-11-13 DIAGNOSIS — R2689 Other abnormalities of gait and mobility: Secondary | ICD-10-CM

## 2019-11-13 DIAGNOSIS — M6281 Muscle weakness (generalized): Secondary | ICD-10-CM

## 2019-11-13 DIAGNOSIS — R262 Difficulty in walking, not elsewhere classified: Secondary | ICD-10-CM

## 2019-11-13 DIAGNOSIS — R41841 Cognitive communication deficit: Secondary | ICD-10-CM | POA: Diagnosis not present

## 2019-11-13 NOTE — Therapy (Signed)
[place] MAIN [facility] SERVICES [address] [place], [place], [address] Phone: [phone]   Fax:  [phone]  Physical Therapy Treatment  Patient Details  Name: Dwayne Robinson MRN: [id number] Date of Birth: 1936/07/13 Referring Provider (PT): Jennings Books   Encounter Date: 11/13/2019  PT End of Session - 11/13/19 0905    Visit Number  7    Number of Visits  17    Date for PT Re-Evaluation  12/06/19    PT Start Time  0900    PT Stop Time  0945    PT Time Calculation (min)  45 min    Equipment Utilized During Treatment  Gait belt    Activity Tolerance  Patient tolerated treatment well;No increased pain;Patient limited by fatigue    Behavior During Therapy  [facility] for tasks assessed/performed       Past Medical History:  Diagnosis Date  . A-fib ([place])   . Arthritis   . Bleeding disorder ([place])   . GERD (gastroesophageal reflux disease)   . Heart attack ([place])   . Heart disease   . Heart failure ([place])   . Heart murmur     Past Surgical History:  Procedure Laterality Date  . CARDIAC SURGERY    . CARDIAC VALVE SURGERY      There were no vitals filed for this visit.  Subjective Assessment - 11/13/19 0903    Subjective  Pt doing well. He reports that he is tired, didnt sleep well    Pertinent History  Patient has had foot neuropathy for 15 years ago. He says that both feet are cold all the way to the knees. He does not use an AD. He has difficutly with steps and climbing up a ladder. He has had some falls several months ago.    Limitations  Standing    How long can you stand comfortably?  15 mins    How long can you walk comfortably?  15 mins    Patient Stated Goals  Patient wants to get better.    Currently in Pain?  No/denies    Pain Score  0-No pain    Pain Onset  More than a month ago       Ther-ex  Octane fitness x 5 mins  Hip flexion marches with 3# ankle weights x 10 bilateral; Hip abduction with 3# x 10  bilateral; HS curls with 3# AW x 10 bilateral; Hip extension with 3# x 10 bilateral; Step ups to 6-inch stool from foam  x 20     Neuromuscular Re-education  Tandem on 1/2 foam  without UE support x 2 lengths Side stepping on foam without UE support x 2 lengths Heel/toe raises without UE support 3s hold x 10 each 1/2 foam roll balance with flat side up 30s x 2 reps 1/2 foam roll balance with flat side down 30s x 2 reps 1/2 foam roll tandem balance alternating forward LE 30s x 2 each LE forward Lateral side steps from foam to 6 inch stool left and right x 15  Patient needs occasional verbal cueing to improve posture and cueing to correctly perform exercises slowly, holding at end of range to increase motor firing of desired muscle to encourage fatigue.      Pt educated throughout session about proper posture and technique with exercises. Improved exercise technique, movement at target joints, use of target muscles after min to mod verbal, visual, tactile cues. CGA and Min to mod verbal cues used  throughout with increased in postural sway and LOB most seen with narrow base of support and while on uneven surfaces. Continues to have balance deficits typical with diagnosis. Patient performs intermediate level exercises without pain behaviors and needs verbal cuing for postural alignment and head positioning Tactile cues and assistance needed to keep lower leg and knee in neutral to avoid compensations with ankle motions.                        PT Education - 11/13/19 0904    Education Details  HEP    Person(s) Educated  Patient    Methods  Explanation    Comprehension  Verbalized understanding;Verbal cues required       PT Short Term Goals - 10/11/19 1319      PT SHORT TERM GOAL #1   Title  Patient will be independent in home exercise program to improve strength/mobility for better functional independence with ADLs.    Time  8    Period  Weeks    Status  New     Target Date  11/08/19        PT Long Term Goals - 10/11/19 1320      PT LONG TERM GOAL #1   Title  Patient (> 39 years old) will complete five times sit to stand test in < 15 seconds indicating an increased LE strength and improved balance.    Time  8    Period  Weeks    Status  New    Target Date  12/05/19      PT LONG TERM GOAL #2   Title  Patient will increase Berg Balance score by > 6 points to demonstrate decreased fall risk during functional activities.    Time  8    Period  Weeks    Status  New    Target Date  12/06/19      PT LONG TERM GOAL #3   Title  Patient will increase six minute walk test distance to >1000 for progression to community ambulator and improve gait ability    Time  8    Period  Weeks    Status  New    Target Date  12/06/19      PT LONG TERM GOAL #4   Title  Patient will increase 10 meter walk test to >1.101m/s as to improve gait speed for better community ambulation and to reduce fall risk.    Time  8    Period  Weeks    Status  New    Target Date  12/06/19            Plan - 11/13/19 0905    Clinical Impression Statement  Patient instructed in  LE strengthening and intermediate balance exercise. Patient required min VCS to improve weight shift and to increase terminal knee extension for better stance control. Patient would benefit from additional skilled PT intervention to improve strength, balance and gait safety.   Personal Factors and Comorbidities  Age    Examination-Activity Limitations  Research officer, political party Overhead    Examination-Participation Restrictions  Community Activity    Rehab Potential  Good    PT Frequency  2x / week    PT Duration  8 weeks    PT Treatment/Interventions  Manual techniques;Neuromuscular re-education;Balance training;Therapeutic exercise;Functional mobility training;Therapeutic activities    PT Next Visit Plan  Continue to progress balance training    PT Home Exercise Plan  No updates this vsisit;  encourage  compliance    Consulted and Agree with Plan of Care  Patient       Patient will benefit from skilled therapeutic intervention in order to improve the following deficits and impairments:  Abnormal gait, Decreased strength, Difficulty walking, Decreased balance, Pain, Decreased mobility  Visit Diagnosis: Difficulty in walking, not elsewhere classified  Other abnormalities of gait and mobility  Muscle weakness (generalized)     Problem List Patient Active Problem List   Diagnosis Date Noted  . Renal mass 01/25/2019  . History of MI (myocardial infarction) 09/23/2018  . LVH (left ventricular hypertrophy) due to hypertensive disease, without heart failure 08/08/2018  . Dizziness 12/02/2017  . SOBOE (shortness of breath on exertion) 10/13/2017  . Atrial flutter, paroxysmal ([facility]) 12/02/2016  . CAD (coronary artery disease) 10/17/2014  . Atherosclerosis of both carotid arteries 06/27/2014  . Benign essential HTN 06/27/2014  . Chronic systolic CHF (congestive heart failure), NYHA class 3 (HCC) 06/27/2014  . Mixed hyperlipidemia 06/27/2014  . Paroxysmal A-fib (HCC) 06/27/2014  . Severe mitral insufficiency 06/27/2014  . Elevated prostate specific antigen (PSA) 07/05/2012  . Enlarged prostate with lower urinary tract symptoms (LUTS) 07/05/2012  . Microscopic hematuria 07/05/2012    Ezekiel Ina, PT DPT 11/13/2019, 9:07 AM  [place] [facility] MAIN [facility] SERVICES [address] [place], [place], [address] Phone: [phone]   Fax:  [phone]  Name: Dwayne Robinson MRN: [id number] Date of Birth: April 01, 1936

## 2019-11-13 NOTE — Therapy (Signed)
[place] [facility] MAIN [facility] SERVICES [address] [place], [place], [address] Phone: [phone]   Fax:  [phone]  Speech Language Pathology Treatment  Patient Details  Name: Dwayne Robinson MRN: [id number] Date of Birth: 1936-02-22 No data recorded  Encounter Date: 11/13/2019  End of Session - 11/13/19 1052    Visit Number  5    Number of Visits  17    Date for SLP Re-Evaluation  12/28/19    Authorization - Visit Number  4    Authorization - Number of Visits  10    SLP Start Time  0945    SLP Stop Time   1045    SLP Time Calculation (min)  60 min    Activity Tolerance  Patient tolerated treatment well;No increased pain;Patient limited by fatigue       Past Medical History:  Diagnosis Date  . A-fib (H[facility])   . Arthritis   . Bleeding disorder (H[facility])   . GERD (gastroesophageal reflux disease)   . Heart attack ([facility])   . Heart disease   . Heart failure (H[facility])   . Heart murmur     Past Surgical History:  Procedure Laterality Date  . CARDIAC SURGERY    . CARDIAC VALVE SURGERY      There were no vitals filed for this visit.  Subjective Assessment - 11/13/19 0950    Subjective  Pt reported he is really tired as he didn't sleep at all last night, and reported he is extra tired from his physical therapy session.    Currently in Pain?  No/denies            ADULT SLP TREATMENT - 11/13/19 0001      General Information   Behavior/Cognition  Alert;Cooperative;Pleasant mood    HPI  This 84 y/o pt of Dr. Margaretmary Eddy is here for evaluation of increasing difficulty with his memory. Dr. 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 Per Dr. Margaretmary Eddy notes, "pt reports he has had gradual onset of issues w/his cognition since around 2019 or so. He no longer drives. He has difficulty w/following conversations, difficulty w/confusion  between day and night. He sleeps a lot during the day. He has shown personality changes such as increased irritability. He has difficulty repairing things, and he cannot remember where his wife is after she tells him." Pt is here seeking assistance for increasing difficulty with his memory.      Treatment Provided   Treatment provided  Cognitive-Linquistic      Pain Assessment   Pain Assessment  No/denies pain      Cognitive-Linquistic Treatment   Treatment focused on  Cognition;Patient/family/caregiver education    Skilled Treatment  Pt composed sentences to aid recall of word lists with 90% accuracy given mod verbal cues. Pt used "memory pegs" to chunk information from paragraphs and used these "pegs" to recall the information with 70% accuracy given regular verbal cues for technique.  Pt described pictured scene with 4-5 descriptive statements and recalled info to answer questions re: pictured scene with 85% accuracy given min cues and 72% accuracy independently. Pt named 1 compensatory strategy (acronyms) to aid short term recall independently and 4 additional strategies w/mod cues: repetition, visualization, association, grouping, and writing it down.       Assessment / Recommendations / Plan   Plan  Continue with current plan of care  Progression Toward Goals   Progression toward goals  Progressing toward goals       SLP Education - 11/13/19 1051    Education Details  re: compensatory strategies to aid short term memory, HEP    Person(s) Educated  Patient    Methods  Explanation;Demonstration;Verbal cues;Handout    Comprehension  Verbalized understanding;Need further instruction;Returned demonstration;Verbal cues required         SLP Long Term Goals - 10/30/19 1337      SLP LONG TERM GOAL #1   Title  Pt will complete auditory and visual attention/vigilance/memory tasks with 80% accuracy given min cues.    Time  8    Period  Weeks    Status  New    Target Date   12/28/19      SLP LONG TERM GOAL #2   Title  Pt will use external memory aids and compensatory strategies to aid short term recall with 80% accuracy.    Time  8    Period  Weeks    Status  New    Target Date  12/28/19      SLP LONG TERM GOAL #3   Title  Pt will complete cognitive linguistic organization and retrieval tasks with 80% accuracy.    Time  8    Period  Weeks    Status  New    Target Date  12/28/19      SLP LONG TERM GOAL #4   Title  Pt will recall facts, answer wh questions following reading functional reading materials (ie menus, receipts, advertisements, etc)  and moderately complex paragraphs with 80% accuracy.    Time  8    Period  Weeks    Status  New    Target Date  12/28/19       Plan - 11/13/19 1052    Clinical Impression Statement  Pt demonstrated decreased accuracy and independence with all tx tasks this session. Pt reported he didn't sleep at all last night and is even more tired now as he just finished his PT session. Fatigue clearly impacted performance in tx today. However, noted improved accuracy with visual vs auditory memory tasks, educated pt how to use his strength of visual memory to aid short term recall. Pt apologized for not completing HEP, but stated he will try to complete it by next session. Pt will continue to benefit from skilled ST tx for cognitive training to restore short term memory skills and provide pt education re: compensatory strategies to improve functional independence.   Speech Therapy Frequency  2x / week    Duration  Other (comment)   8 weeks   Treatment/Interventions  Cognitive reorganization;SLP instruction and feedback;Compensatory strategies;Functional tasks;Patient/family education;Internal/external aids;Cueing hierarchy    Potential to Achieve Goals  Good    Potential Considerations  Ability to learn/carryover information;Family/community support;Previous level of function;Cooperation/participation level    SLP Home Exercise  Plan  Working memory, cognitive flexibility exercises    Consulted and Agree with Plan of Care  Patient       Patient will benefit from skilled therapeutic intervention in order to improve the following deficits and impairments:   Cognitive communication deficit    Problem List Patient Active Problem List   Diagnosis Date Noted  . Renal mass 01/25/2019  . History of MI (myocardial infarction) 09/23/2018  . LVH (left ventricular hypertrophy) due to hypertensive disease, without heart failure 08/08/2018  . Dizziness 12/02/2017  . SOBOE (shortness of breath on exertion) 10/13/2017  .  Atrial flutter, paroxysmal (HCC) 12/02/2016  . CAD (coronary artery disease) 10/17/2014  . Atherosclerosis of both carotid arteries 06/27/2014  . Benign essential HTN 06/27/2014  . Chronic systolic CHF (congestive heart failure), NYHA class 3 (HCC) 06/27/2014  . Mixed hyperlipidemia 06/27/2014  . Paroxysmal A-fib (HCC) 06/27/2014  . Severe mitral insufficiency 06/27/2014  . Elevated prostate specific antigen (PSA) 07/05/2012  . Enlarged prostate with lower urinary tract symptoms (LUTS) 07/05/2012  . Microscopic hematuria 07/05/2012    Jeannette How, MA, CCC-SLP 11/13/2019, 12:50 PM  [facility] MAIN [facility] SERVICES [address] [place], [place], [address] Phone: [phone]   Fax:  [phone]   Name: Jahmez Bily MRN: [id number] Date of Birth: 1936-03-01

## 2019-11-16 ENCOUNTER — Ambulatory Visit: Payer: Medicare PPO | Admitting: Speech Pathology

## 2019-11-16 ENCOUNTER — Ambulatory Visit: Payer: Medicare PPO | Admitting: Physical Therapy

## 2019-11-20 ENCOUNTER — Encounter: Payer: Self-pay | Admitting: Speech Pathology

## 2019-11-20 ENCOUNTER — Other Ambulatory Visit: Payer: Self-pay

## 2019-11-20 ENCOUNTER — Encounter: Payer: Self-pay | Admitting: Physical Therapy

## 2019-11-20 ENCOUNTER — Ambulatory Visit: Payer: Medicare PPO | Admitting: Physical Therapy

## 2019-11-20 ENCOUNTER — Ambulatory Visit: Payer: Medicare PPO | Admitting: Speech Pathology

## 2019-11-20 DIAGNOSIS — R41841 Cognitive communication deficit: Secondary | ICD-10-CM

## 2019-11-20 DIAGNOSIS — R2689 Other abnormalities of gait and mobility: Secondary | ICD-10-CM

## 2019-11-20 DIAGNOSIS — M6281 Muscle weakness (generalized): Secondary | ICD-10-CM

## 2019-11-20 DIAGNOSIS — R262 Difficulty in walking, not elsewhere classified: Secondary | ICD-10-CM

## 2019-11-20 NOTE — Therapy (Signed)
[place] [facility] MAIN [facility] SERVICES [address] [place], [place], [address] Phone: [phone]   Fax:  [phone]  Physical Therapy Treatment  Patient Details  Name: Dwayne Robinson MRN: [id number] Date of Birth: 28-Mar-1936 Referring Provider (PT): Cristopher Peru   Encounter Date: 11/20/2019  PT End of Session - 11/20/19 1054    Visit Number  8    Number of Visits  17    Date for PT Re-Evaluation  12/06/19    PT Start Time  1100    PT Stop Time  1140    PT Time Calculation (min)  40 min    Equipment Utilized During Treatment  Gait belt    Activity Tolerance  Patient tolerated treatment well;No increased pain;Patient limited by fatigue    Behavior During Therapy  [facility] for tasks assessed/performed       Past Medical History:  Diagnosis Date  . A-fib (HCC)   . Arthritis   . Bleeding disorder (HCC)   . GERD (gastroesophageal reflux disease)   . Heart attack (HCC)   . Heart disease   . Heart failure (HCC)   . Heart murmur     Past Surgical History:  Procedure Laterality Date  . CARDIAC SURGERY    . CARDIAC VALVE SURGERY      There were no vitals filed for this visit.    Treatment: Neuromuscular training: side stepping left and right in parallel bars 10 feet x 3 standing on blue foam with head turns x 1 min  Standing feet together on blue foam with head turns x 1 min step ups from floor to 6 inch stool x 20 bilateral Step ups from blue foam to 6 inch stool x 20 BLE  Ther-ex  Octane fitness x 5 mins  Hip flexion marches with 2# ankle weights x 10 bilateral; Hip abduction with 2# x 10 bilateral; HS curls with 2# AW x 10 bilateral; Hip extension with 2# x 10 bilateral; High marching x 20 BLE   Patient needs occasional verbal cueing to improve posture and cueing to correctly perform exercises slowly, holding at end of range to increase motor firing of desired muscle to encourage fatigue.  Pt educated throughout session  about proper posture and technique with exercises. Improved exercise technique, movement at target joints, use of target muscles after min to mod verbal, visual, tactile cues. CGA and Min to mod verbal cues used throughout with increased in postural sway and LOB most seen with narrow base of support and while on uneven surfaces.                       PT Education - 11/20/19 1053    Education Details  HEP    Person(s) Educated  Patient    Methods  Explanation    Comprehension  Verbalized understanding;Returned demonstration;Verbal cues required;Need further instruction       PT Short Term Goals - 10/11/19 1319      PT SHORT TERM GOAL #1   Title  Patient will be independent in home exercise program to improve strength/mobility for better functional independence with ADLs.    Time  8    Period  Weeks    Status  New    Target Date  11/08/19        PT Long Term Goals - 10/11/19 1320      PT LONG TERM GOAL #1   Title  Patient (> 67 years old) will complete  five times sit to stand test in < 15 seconds indicating an increased LE strength and improved balance.    Time  8    Period  Weeks    Status  New    Target Date  12/05/19      PT LONG TERM GOAL #2   Title  Patient will increase Berg Balance score by > 6 points to demonstrate decreased fall risk during functional activities.    Time  8    Period  Weeks    Status  New    Target Date  12/06/19      PT LONG TERM GOAL #3   Title  Patient will increase six minute walk test distance to >1000 for progression to community ambulator and improve gait ability    Time  8    Period  Weeks    Status  New    Target Date  12/06/19      PT LONG TERM GOAL #4   Title  Patient will increase 10 meter walk test to >1.71m/s as to improve gait speed for better community ambulation and to reduce fall risk.    Time  8    Period  Weeks    Status  New    Target Date  12/06/19            Plan - 11/20/19 1054    Clinical  Impression Statement  Pt demonstrates increased postural sway when standing on uneven surface and requires // bars to steady, and demonstrates fatigue at end of set of exercises focused on strength and endurance.  Patient will continue to benefit from skilled PT for improved balance and strength   Personal Factors and Comorbidities  Age    Examination-Activity Limitations  Locomotion Level;Reach Overhead    Examination-Participation Restrictions  Community Activity    Rehab Potential  Good    PT Frequency  2x / week    PT Duration  8 weeks    PT Treatment/Interventions  Manual techniques;Neuromuscular re-education;Balance training;Therapeutic exercise;Functional mobility training;Therapeutic activities    PT Next Visit Plan  Continue to progress balance training    PT Home Exercise Plan  No updates this vsisit; encourage compliance    Consulted and Agree with Plan of Care  Patient       Patient will benefit from skilled therapeutic intervention in order to improve the following deficits and impairments:  Abnormal gait, Decreased strength, Difficulty walking, Decreased balance, Pain, Decreased mobility  Visit Diagnosis: Cognitive communication deficit  Difficulty in walking, not elsewhere classified  Other abnormalities of gait and mobility  Muscle weakness (generalized)     Problem List Patient Active Problem List   Diagnosis Date Noted  . Renal mass 01/25/2019  . History of MI (myocardial infarction) 09/23/2018  . LVH (left ventricular hypertrophy) due to hypertensive disease, without heart failure 08/08/2018  . Dizziness 12/02/2017  . SOBOE (shortness of breath on exertion) 10/13/2017  . Atrial flutter, paroxysmal ([place]) 12/02/2016  . CAD (coronary artery disease) 10/17/2014  . Atherosclerosis of both carotid arteries 06/27/2014  . Benign essential HTN 06/27/2014  . Chronic systolic CHF (congestive heart failure), NYHA class 3 ([place]) 06/27/2014  . Mixed hyperlipidemia  06/27/2014  . Paroxysmal A-fib ([place]) 06/27/2014  . Severe mitral insufficiency 06/27/2014  . Elevated prostate specific antigen (PSA) 07/05/2012  . Enlarged prostate with lower urinary tract symptoms (LUTS) 07/05/2012  . Microscopic hematuria 07/05/2012    Alanson Puls, PT DPT 11/20/2019, 10:57 AM  [place]  [facility] MAIN [facility] SERVICES [address] [place], [place], [address] Phone: [phone]   Fax:  [phone]  Name: Dwayne Robinson MRN: [id number] Date of Birth: 1936/06/29

## 2019-11-20 NOTE — Therapy (Signed)
[facility] MAIN [facility] SERVICES [address] [place], [place], [address] Phone: [phone]   Fax:  [phone]  Speech Language Pathology Treatment  Patient Details  Name: Dwayne Robinson MRN: [id number] Date of Birth: 13-Jun-1936 No data recorded  Encounter Date: 11/20/2019  End of Session - 11/20/19 1558    Visit Number  6    Number of Visits  17    Date for SLP Re-Evaluation  12/28/19    Authorization - Visit Number  5    Authorization - Number of Visits  10    SLP Start Time  1006   pt arrived a few min late to tx   SLP Stop Time   1055    SLP Time Calculation (min)  49 min    Activity Tolerance  Patient tolerated treatment well       Past Medical History:  Diagnosis Date  . A-fib (HCC)   . Arthritis   . Bleeding disorder (HCC)   . GERD (gastroesophageal reflux disease)   . Heart attack (HCC)   . Heart disease   . Heart failure (HCC)   . Heart murmur     Past Surgical History:  Procedure Laterality Date  . CARDIAC SURGERY    . CARDIAC VALVE SURGERY      There were no vitals filed for this visit.  Subjective Assessment - 11/20/19 1010    Subjective  Pt reported he had significant shoulder pain following last PT session, reported it is better now but still tender to the touch. He remembered to bring his HEP with him to tx today.    Currently in Pain?  Yes    Pain Score  2     Pain Location  Shoulder    Pain Orientation  Left    Pain Descriptors / Indicators  Aching    Aggravating Factors   LUE movement    Pain Relieving Factors  taking it easy            ADULT SLP TREATMENT - 11/20/19 0001      General Information   Behavior/Cognition  Alert;Cooperative;Pleasant mood    HPI  This 84 y/o pt of Dr. Margaretmary Eddy is here for evaluation of increasing difficulty with his memory. Dr. 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 Per Dr. Margaretmary Eddy notes, "pt reports he has had gradual onset of issues w/his cognition since around 2019 or so. He no longer drives. He has difficulty w/following conversations, difficulty w/confusion between day and night. He sleeps a lot during the day. He has shown personality changes such as increased irritability. He has difficulty repairing things, and he cannot remember where his wife is after she tells him." Pt is here seeking assistance for increasing difficulty with his memory.      Treatment Provided   Treatment provided  Cognitive-Linquistic      Pain Assessment   Pain Assessment  No/denies pain      Cognitive-Linquistic Treatment   Treatment focused on  Cognition;Patient/family/caregiver education    Skilled Treatment   Pt performed concrete and abstract convergent naming tasks given category members with 90% accuracy given intermittent min verbal cues. Pt used "memory pegs" to chunk information from paragraphs and used these "pegs" to recall the information with 80% accuracy given regular verbal cues for technique.  Pt identified and recalled key elements in paragraphs (4-5 sentence)  presented aloud with 60% accuracy given mod verbal cues, 40% independently.        Assessment / Recommendations / Plan   Plan  Continue with current plan of care      Progression Toward Goals   Progression toward goals  Progressing toward goals       SLP Education - 11/20/19 1557    Education Details  re: compensatory strategies to aid short term memory, rec'd HEP exercises    Person(s) Educated  Patient    Methods  Explanation;Demonstration;Verbal cues;Handout    Comprehension  Verbalized understanding;Need further instruction;Returned demonstration;Verbal cues required         SLP Long Term Goals - 10/30/19 1337      SLP LONG TERM GOAL #1   Title  Pt will complete auditory and visual attention/vigilance/memory tasks with 80% accuracy given min cues.     Time  8    Period  Weeks    Status  New    Target Date  12/28/19      SLP LONG TERM GOAL #2   Title  Pt will use external memory aids and compensatory strategies to aid short term recall with 80% accuracy.    Time  8    Period  Weeks    Status  New    Target Date  12/28/19      SLP LONG TERM GOAL #3   Title  Pt will complete cognitive linguistic organization and retrieval tasks with 80% accuracy.    Time  8    Period  Weeks    Status  New    Target Date  12/28/19      SLP LONG TERM GOAL #4   Title  Pt will recall facts, answer wh questions following reading functional reading materials (ie menus, receipts, advertisements, etc)  and moderately complex paragraphs with 80% accuracy.    Time  8    Period  Weeks    Status  New    Target Date  12/28/19       Plan - 11/20/19 1558    Clinical Impression Statement  Pt continues to require regular verbal cues to use compensatory strategies to aid short term recall and working memory throughout tx. Pt reported he doesn't have time to work on homework assigned, but remembered to bring his folder to tx today. Pt reported he knows he needs to do it, but is having trouble committing to doing home practice. Pt will continue to benefit from skilled ST tx for cognitive training to restore short term & working memory skills and provide pt education re: compensatory strategies to improve functional independence.   Speech Therapy Frequency  2x / week    Duration  Other (comment)    Treatment/Interventions  Cognitive reorganization;SLP instruction and feedback;Compensatory strategies;Functional tasks;Patient/family education;Internal/external aids;Cueing hierarchy    Potential to Achieve Goals  Good    Potential Considerations  Ability to learn/carryover information;Family/community support;Previous level of function;Cooperation/participation level    SLP Home Exercise Plan  Working memory, cognitive flexibility exercises    Consulted and Agree with  Plan of Care  Patient       Patient will benefit from skilled therapeutic intervention in order to improve the following deficits and impairments:   Cognitive communication deficit    Problem List Patient Active Problem List   Diagnosis Date Noted  . Renal mass 01/25/2019  . History of MI (myocardial infarction) 09/23/2018  . LVH (left ventricular hypertrophy) due to hypertensive disease, without heart failure 08/08/2018  .  Dizziness 12/02/2017  . SOBOE (shortness of breath on exertion) 10/13/2017  . Atrial flutter, paroxysmal ([place]) 12/02/2016  . CAD (coronary artery disease) 10/17/2014  . Atherosclerosis of both carotid arteries 06/27/2014  . Benign essential HTN 06/27/2014  . Chronic systolic CHF (congestive heart failure), NYHA class 3 ([place]) 06/27/2014  . Mixed hyperlipidemia 06/27/2014  . Paroxysmal A-fib ([place]) 06/27/2014  . Severe mitral insufficiency 06/27/2014  . Elevated prostate specific antigen (PSA) 07/05/2012  . Enlarged prostate with lower urinary tract symptoms (LUTS) 07/05/2012  . Microscopic hematuria 07/05/2012    Rulon Eisenmenger, MA, CCC-SLP 11/20/2019, 4:23 PM  [place] MAIN [facility] SERVICES [address] [place], [place], [address] Phone: [phone]   Fax:  [phone]   Name: Dwayne Robinson MRN: [id number] Date of Birth: Jul 06, 1936

## 2019-11-23 ENCOUNTER — Encounter: Payer: Medicare PPO | Admitting: Speech Pathology

## 2019-11-27 ENCOUNTER — Other Ambulatory Visit: Payer: Self-pay

## 2019-11-27 ENCOUNTER — Ambulatory Visit: Payer: Medicare PPO | Admitting: Physical Therapy

## 2019-11-27 ENCOUNTER — Encounter: Payer: Self-pay | Admitting: Physical Therapy

## 2019-11-27 ENCOUNTER — Ambulatory Visit: Payer: Medicare PPO | Admitting: Speech Pathology

## 2019-11-27 DIAGNOSIS — R262 Difficulty in walking, not elsewhere classified: Secondary | ICD-10-CM

## 2019-11-27 DIAGNOSIS — R41841 Cognitive communication deficit: Secondary | ICD-10-CM | POA: Diagnosis not present

## 2019-11-27 DIAGNOSIS — M6281 Muscle weakness (generalized): Secondary | ICD-10-CM

## 2019-11-27 DIAGNOSIS — R2689 Other abnormalities of gait and mobility: Secondary | ICD-10-CM

## 2019-11-27 NOTE — Therapy (Signed)
[place] [facility] MAIN [facility] SERVICES [address] [place], [place], [address] Phone: [phone]   Fax:  [phone]  Speech Language Pathology Treatment  Patient Details  Name: Dwayne Robinson MRN: [id number] Date of Birth: 08/12/1936 No data recorded  Encounter Date: 11/27/2019  End of Session - 11/27/19 1113    Visit Number  7    Number of Visits  17    Date for SLP Re-Evaluation  12/28/19    Authorization - Visit Number  6    Authorization - Number of Visits  10    SLP Start Time  1000    SLP Stop Time   1050    SLP Time Calculation (min)  50 min    Activity Tolerance  Patient tolerated treatment well       Past Medical History:  Diagnosis Date  . A-fib (HCC)   . Arthritis   . Bleeding disorder (HCC)   . GERD (gastroesophageal reflux disease)   . Heart attack (HCC)   . Heart disease   . Heart failure (HCC)   . Heart murmur     Past Surgical History:  Procedure Laterality Date  . CARDIAC SURGERY    . CARDIAC VALVE SURGERY      There were no vitals filed for this visit.  Subjective Assessment - 11/27/19 1002    Subjective  "I forgot to bring it," re" speech folder    Currently in Pain?  No/denies            ADULT SLP TREATMENT - 11/27/19 1112      General Information   Behavior/Cognition  Alert;Cooperative;Pleasant mood    HPI  This 84 y/o pt of Dr. Margaretmary Eddy is here for evaluation of increasing difficulty with his memory. Dr. 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 Per Dr. Margaretmary Eddy notes, "pt reports he has had gradual onset of issues w/his cognition since around 2019 or so. He no longer drives. He has difficulty w/following conversations, difficulty w/confusion between day and night. He sleeps a lot during the day. He has shown personality changes such as increased irritability. He has difficulty  repairing things, and he cannot remember where his wife is after she tells him." Pt is here seeking assistance for increasing difficulty with his memory.      Cognitive-Linquistic Treatment   Treatment focused on  Cognition;Patient/family/caregiver education    Skilled Treatment  Pt used chunking with 6 word lists to improve recall from 20% to 87% with occasional min cues. Used memory strategy (pegs) to recall 75% of photo details. SLP reviewed memory strategies with pt using WARM (write it down, association, repeat, mental picture) accronym. Pt used mental imaging to recall details from from 3-4 sentence paragraphs 90% accuracy. Category naming 90% accuracy (error with abstract concept).      Assessment / Recommendations / Plan   Plan  Continue with current plan of care      Progression Toward Goals   Progression toward goals  Progressing toward goals       SLP Education - 11/27/19 1112    Education Details  WARM memory strategies    Person(s) Educated  Patient    Methods  Explanation;Verbal cues;Demonstration    Comprehension  Verbalized understanding;Returned demonstration;Verbal cues required;Need further instruction         SLP Long Term Goals - 11/27/19 1113  SLP LONG TERM GOAL #1   Title  Pt will complete auditory and visual attention/vigilance/memory tasks with 80% accuracy given min cues.    Time  8    Period  Weeks    Status  New      SLP LONG TERM GOAL #2   Title  Pt will use external memory aids and compensatory strategies to aid short term recall with 80% accuracy.    Time  8    Period  Weeks    Status  New      SLP LONG TERM GOAL #3   Title  Pt will complete cognitive linguistic organization and retrieval tasks with 80% accuracy.    Time  8    Period  Weeks    Status  New      SLP LONG TERM GOAL #4   Title  Pt will recall facts, answer wh questions following reading functional reading materials (ie menus, receipts, advertisements, etc)  and moderately  complex paragraphs with 80% accuracy.    Time  8    Period  Weeks    Status  New       Plan - 11/27/19 1113    Clinical Impression Statement  Pt continues to require cuing for carry over of compensatory strategies to aid recall/working memory. Did not bring his ST folder to tx today. SLP educated pt re: opportunities (such as making a grocery list) to use compensations trained in session today. He has not been consistent with assignments outside of therapy. Pt will continue to benefit from skilled ST tx for cognitive training to restore short term & working memory skills and provide pt education re: compensatory strategies to improve functional independence.   Speech Therapy Frequency  2x / week    Duration  Other (comment)    Treatment/Interventions  Cognitive reorganization;SLP instruction and feedback;Compensatory strategies;Functional tasks;Patient/family education;Internal/external aids;Cueing hierarchy    Potential to Achieve Goals  Good    Potential Considerations  Ability to learn/carryover information;Family/community support;Previous level of function;Cooperation/participation level    SLP Home Exercise Plan  Working memory, cognitive flexibility exercises    Consulted and Agree with Plan of Care  Patient       Patient will benefit from skilled therapeutic intervention in order to improve the following deficits and impairments:   Cognitive communication deficit    Problem List Patient Active Problem List   Diagnosis Date Noted  . Renal mass 01/25/2019  . History of MI (myocardial infarction) 09/23/2018  . LVH (left ventricular hypertrophy) due to hypertensive disease, without heart failure 08/08/2018  . Dizziness 12/02/2017  . SOBOE (shortness of breath on exertion) 10/13/2017  . Atrial flutter, paroxysmal ([place]) 12/02/2016  . CAD (coronary artery disease) 10/17/2014  . Atherosclerosis of both carotid arteries 06/27/2014  . Benign essential HTN 06/27/2014  . Chronic  systolic CHF (congestive heart failure), NYHA class 3 ([place]) 06/27/2014  . Mixed hyperlipidemia 06/27/2014  . Paroxysmal A-fib ([place]) 06/27/2014  . Severe mitral insufficiency 06/27/2014  . Elevated prostate specific antigen (PSA) 07/05/2012  . Enlarged prostate with lower urinary tract symptoms (LUTS) 07/05/2012  . Microscopic hematuria 07/05/2012   Deneise Lever, [place], [place] Speech-Language Pathologist  Aliene Altes 11/27/2019, 11:14 AM  [place] MAIN [facility] SERVICES [address] [place], [place], [address] Phone: [phone]   Fax:  [phone]   Name: Dwayne Robinson MRN: [id number] Date of Birth: 1936-06-10

## 2019-11-27 NOTE — Therapy (Signed)
[place] [facility] MAIN [facility] SERVICES [address] [place], [place], [address] Phone: [phone]   Fax:  [phone]  Physical Therapy Treatment  Patient Details  Name: Dwayne Robinson MRN: [id number] Date of Birth: 1936-03-25 Referring Provider (PT): Cristopher Peru   Encounter Date: 11/27/2019  PT End of Session - 11/27/19 1103    Visit Number  9    Number of Visits  17    Date for PT Re-Evaluation  12/06/19    PT Start Time  1100    PT Stop Time  1140    PT Time Calculation (min)  40 min    Equipment Utilized During Treatment  Gait belt    Activity Tolerance  Patient tolerated treatment well;No increased pain;Patient limited by fatigue    Behavior During Therapy  [facility] for tasks assessed/performed       Past Medical History:  Diagnosis Date  . A-fib (HCC)   . Arthritis   . Bleeding disorder (HCC)   . GERD (gastroesophageal reflux disease)   . Heart attack (HCC)   . Heart disease   . Heart failure (HCC)   . Heart murmur     Past Surgical History:  Procedure Laterality Date  . CARDIAC SURGERY    . CARDIAC VALVE SURGERY      There were no vitals filed for this visit.  Subjective Assessment - 11/27/19 1102    Subjective  Pt doing well. He reports that he is tired, didnt sleep well    Pertinent History  Patient has had foot neuropathy for 15 years ago. He says that both feet are cold all the way to the knees. He does not use an AD. He has difficutly with steps and climbing up a ladder. He has had some falls several months ago.    Limitations  Standing    How long can you stand comfortably?  15 mins    How long can you walk comfortably?  15 mins    Patient Stated Goals  Patient wants to get better.    Currently in Pain?  No/denies    Pain Score  0-No pain    Pain Onset  More than a month ago      Treatment: [employer] fitness x 5 mins  Neuromuscular Re-education  Hurdle step over fwd/bwd, side to side Tandem stand on floor  without UE support x 2 lengths Side stepping on floor  without UE support x 2 lengths Heel/toe raises without UE support 3s hold x 10 each 1/2 foam roll balance with flat side up 30s x 2 reps 1/2 foam roll balance with flat side down 30s x 2 reps Lateral side steps from foam to 6 inch stool left and right x 15 Backwards stepping from foam to 6 inch stool x 15  Ther-ex  Hip flexion marches with 2# ankle weights x 10 bilateral; Hip abduction with 2# x 10 bilateral Hip extension with 2# x 10 bilateral Lunges to BOSU ball x 15 BLE High marching x 20 BLE Step ups to 6-inch stool x 20   Patient needs occasional verbal cueing to improve posture and cueing to correctly perform exercises slowly, holding at end of range to increase motor firing of desired muscle to encourage fatigue.  Pt educated throughout session about proper posture and technique with exercises. Improved exercise technique, movement at target joints, use of target muscles after min to mod verbal, visual, tactile cues. CGA and Min to mod verbal cues used throughout with  increased in postural sway and LOB most seen with narrow base of support and while on uneven surfaces.                       PT Education - 11/27/19 1102    Education Details  HEP    Person(s) Educated  Patient    Methods  Explanation;Demonstration    Comprehension  Verbalized understanding;Returned demonstration;Tactile cues required;Need further instruction       PT Short Term Goals - 10/11/19 1319      PT SHORT TERM GOAL #1   Title  Patient will be independent in home exercise program to improve strength/mobility for better functional independence with ADLs.    Time  8    Period  Weeks    Status  New    Target Date  11/08/19        PT Long Term Goals - 10/11/19 1320      PT LONG TERM GOAL #1   Title  Patient (> 64 years old) will complete five times sit to stand test in < 15 seconds indicating an increased LE strength and  improved balance.    Time  8    Period  Weeks    Status  New    Target Date  12/05/19      PT LONG TERM GOAL #2   Title  Patient will increase Berg Balance score by > 6 points to demonstrate decreased fall risk during functional activities.    Time  8    Period  Weeks    Status  New    Target Date  12/06/19      PT LONG TERM GOAL #3   Title  Patient will increase six minute walk test distance to >1000 for progression to community ambulator and improve gait ability    Time  8    Period  Weeks    Status  New    Target Date  12/06/19      PT LONG TERM GOAL #4   Title  Patient will increase 10 meter walk test to >1.58m/s as to improve gait speed for better community ambulation and to reduce fall risk.    Time  8    Period  Weeks    Status  New    Target Date  12/06/19            Plan - 11/27/19 1103    Clinical Impression Statement  Patient instructed in  LE strengthening and intermediate balance exercise. Patient required min VCS to improve weight shift and to increase terminal knee extension for better stance control. Patient would benefit from additional skilled PT intervention to improve strength, balance and gait safety.   Personal Factors and Comorbidities  Age    Examination-Activity Limitations  Research officer, political party Overhead    Examination-Participation Restrictions  Community Activity    Rehab Potential  Good    PT Frequency  2x / week    PT Duration  8 weeks    PT Treatment/Interventions  Manual techniques;Neuromuscular re-education;Balance training;Therapeutic exercise;Functional mobility training;Therapeutic activities    PT Next Visit Plan  Continue to progress balance training    PT Home Exercise Plan  No updates this vsisit; encourage compliance    Consulted and Agree with Plan of Care  Patient       Patient will benefit from skilled therapeutic intervention in order to improve the following deficits and impairments:  Abnormal gait, Decreased strength,  Difficulty walking, Decreased  balance, Pain, Decreased mobility  Visit Diagnosis: Cognitive communication deficit  Difficulty in walking, not elsewhere classified  Other abnormalities of gait and mobility  Muscle weakness (generalized)     Problem List Patient Active Problem List   Diagnosis Date Noted  . Renal mass 01/25/2019  . History of MI (myocardial infarction) 09/23/2018  . LVH (left ventricular hypertrophy) due to hypertensive disease, without heart failure 08/08/2018  . Dizziness 12/02/2017  . SOBOE (shortness of breath on exertion) 10/13/2017  . Atrial flutter, paroxysmal ([place]) 12/02/2016  . CAD (coronary artery disease) 10/17/2014  . Atherosclerosis of both carotid arteries 06/27/2014  . Benign essential HTN 06/27/2014  . Chronic systolic CHF (congestive heart failure), NYHA class 3 ([place]) 06/27/2014  . Mixed hyperlipidemia 06/27/2014  . Paroxysmal A-fib ([place]) 06/27/2014  . Severe mitral insufficiency 06/27/2014  . Elevated prostate specific antigen (PSA) 07/05/2012  . Enlarged prostate with lower urinary tract symptoms (LUTS) 07/05/2012  . Microscopic hematuria 07/05/2012    Alanson Puls PT DPT 11/27/2019, 11:05 AM  [place] MAIN [facility] SERVICES [address] [place], [place], [address] Phone: [phone]   Fax:  [phone]  Name: Dwayne Robinson MRN: [id number] Date of Birth: 12-04-1935

## 2019-11-30 ENCOUNTER — Ambulatory Visit: Payer: Medicare PPO | Admitting: Physical Therapy

## 2019-11-30 ENCOUNTER — Ambulatory Visit: Payer: Medicare PPO | Admitting: Speech Pathology

## 2019-12-04 ENCOUNTER — Ambulatory Visit: Payer: Medicare PPO | Admitting: Physical Therapy

## 2019-12-04 ENCOUNTER — Other Ambulatory Visit: Payer: Self-pay

## 2019-12-04 ENCOUNTER — Encounter: Payer: Self-pay | Admitting: Physical Therapy

## 2019-12-04 ENCOUNTER — Ambulatory Visit: Payer: Medicare PPO | Admitting: Speech Pathology

## 2019-12-04 DIAGNOSIS — M6281 Muscle weakness (generalized): Secondary | ICD-10-CM

## 2019-12-04 DIAGNOSIS — R41841 Cognitive communication deficit: Secondary | ICD-10-CM

## 2019-12-04 DIAGNOSIS — R262 Difficulty in walking, not elsewhere classified: Secondary | ICD-10-CM

## 2019-12-04 DIAGNOSIS — R2689 Other abnormalities of gait and mobility: Secondary | ICD-10-CM

## 2019-12-04 NOTE — Patient Instructions (Addendum)
Attention and Memory tips   Memory Strategies  W - Write it down  A - Associate it with something  R - Repeat it  M - Mental Image  If you are losing things, try a strategy:   Keep a basket by your door with the things you need to take with you when you go somewhere. Make sure you return things to the basket when you get home.  Organize! Stacks of paper and clutter make it hard to focus. When you finish something, put it back where it goes so you can find it next time.  If you get interrupted or have to take a break when you're working on something, write yourself a note so you can remember where you left off.   Consider keeping a little box or bucket in your garage where you can put the parts/tools you're actively using.

## 2019-12-04 NOTE — Therapy (Signed)
[place] MAIN [facility] SERVICES [address] [place], [place], [address] Phone: [phone]   Fax:  [phone]  Speech Language Pathology Treatment and Discharge Summary Patient Details  Name: Derl Abalos MRN: [id number] Date of Birth: 11-19-1935 No data recorded  Encounter Date: 12/04/2019  End of Session - 12/04/19 1011    Visit Number  8    Number of Visits  17    Date for SLP Re-Evaluation  12/28/19    Authorization - Visit Number  7    Authorization - Number of Visits  10    SLP Start Time  1000    SLP Stop Time   1050    SLP Time Calculation (min)  50 min    Activity Tolerance  Patient tolerated treatment well       Past Medical History:  Diagnosis Date  . A-fib ([place])   . Arthritis   . Bleeding disorder ([place])   . GERD (gastroesophageal reflux disease)   . Heart attack ([place])   . Heart disease   . Heart failure ([place])   . Heart murmur     Past Surgical History:  Procedure Laterality Date  . CARDIAC SURGERY    . CARDIAC VALVE SURGERY      There were no vitals filed for this visit.  Subjective Assessment - 12/04/19 1002    Subjective  "How many more of these things do I got?"    Currently in Pain?  No/denies            ADULT SLP TREATMENT - 12/04/19 1005      General Information   Behavior/Cognition  Alert;Cooperative;Pleasant mood      Treatment Provided   Treatment provided  Cognitive-Linquistic      Pain Assessment   Pain Assessment  No/denies pain      Cognitive-Linquistic Treatment   Treatment focused on  Cognition;Patient/family/caregiver education    Skilled Treatment  Based on pt's "S" statement, SLP discussed POC and remaining visits. Pt wishes to be d/c after today's visit, with one remaining appointment to be cancelled. SLP reviewed memory strategies trained in previous sessions with pt; he recalled 1/4. Pt used chunking/memory PEGs to recall 83% of words from 6 word lists. With occasional  min cues accuracy 95%. Auditory vigilance task 80% accuracy with occasional min cues. Pt answered [place] questions re: simple-mod complex paragraphs 66% accuracy, with mod cues needed to use strategies for attention/working memory/recall. SLP reviewed memory tips and strategies and provided pt with education and strategies to use at home in functional situations such as organizing his tool shed, keeping up with items he frequently loses (see pt instructions).      Assessment / Recommendations / Plan   Plan  Discharge SLP treatment due to (comment)   pt request     Progression Toward Goals   Progression toward goals  Progressing toward goals           SLP Long Term Goals - 12/04/19 1014      SLP LONG TERM GOAL #1   Title  Pt will complete auditory and visual attention/vigilance/memory tasks with 80% accuracy given min cues.    Time  8    Period  Weeks    Status  Achieved      SLP LONG TERM GOAL #2   Title  Pt will use external memory aids and compensatory strategies to aid short term recall with 80% accuracy.    Time  8  Period  Weeks    Status  Achieved      SLP LONG TERM GOAL #3   Title  Pt will complete cognitive linguistic organization and retrieval tasks with 80% accuracy.    Time  8    Period  Weeks    Status  Not Met      SLP LONG TERM GOAL #4   Title  Pt will recall facts, answer wh questions following reading functional reading materials (ie menus, receipts, advertisements, etc)  and moderately complex paragraphs with 80% accuracy.    Time  8    Period  Weeks    Status  Not Met       Plan - 12/04/19 1012    Clinical Impression Statement  Mild-moderate cognitive deficits persist. Patient used strategies to aid recall/working memory with occasional min cues. Progress has been limited by pt attendance and because he has not been consistent with assignments outside of therapy. He is discharged today at his request; he has met 2/4 LTGs.   Speech Therapy Frequency  --    d/c   Duration  --   d/c   Treatment/Interventions  Cognitive reorganization;SLP instruction and feedback;Compensatory strategies;Functional tasks;Patient/family education;Internal/external aids;Cueing hierarchy    Potential to Achieve Goals  Good    Potential Considerations  Ability to learn/carryover information;Family/community support;Previous level of function;Cooperation/participation level    SLP Home Exercise Plan  Working memory, cognitive flexibility exercises    Consulted and Agree with Plan of Care  Patient       Patient will benefit from skilled therapeutic intervention in order to improve the following deficits and impairments:   Cognitive communication deficit    Problem List Patient Active Problem List   Diagnosis Date Noted  . Renal mass 01/25/2019  . History of MI (myocardial infarction) 09/23/2018  . LVH (left ventricular hypertrophy) due to hypertensive disease, without heart failure 08/08/2018  . Dizziness 12/02/2017  . SOBOE (shortness of breath on exertion) 10/13/2017  . Atrial flutter, paroxysmal ([place]) 12/02/2016  . CAD (coronary artery disease) 10/17/2014  . Atherosclerosis of both carotid arteries 06/27/2014  . Benign essential HTN 06/27/2014  . Chronic systolic CHF (congestive heart failure), NYHA class 3 ([place]) 06/27/2014  . Mixed hyperlipidemia 06/27/2014  . Paroxysmal A-fib ([place]) 06/27/2014  . Severe mitral insufficiency 06/27/2014  . Elevated prostate specific antigen (PSA) 07/05/2012  . Enlarged prostate with lower urinary tract symptoms (LUTS) 07/05/2012  . Microscopic hematuria 07/05/2012    SPEECH THERAPY DISCHARGE SUMMARY  Visits from Start of Care: 8  Current functional level related to goals / functional outcomes: Patient is using memory strategies with occasional min cues. Met 2/4 LTGs (see goals above).   Remaining deficits: Mild to moderate cognitive communication impairment with deficits in short term recall, working memory,  Animator & flexibility, and attention   Education / Equipment: Attention and memory strategies handout provided (see pt instructions)  Plan: Patient agrees to discharge.  Patient goals were partially met. Patient is being discharged due to the patient's request.  ?????          Deneise Lever, [place], CCC-SLP Speech-Language Pathologist  Aliene Altes 12/04/2019, 11:11 AM  [place] MAIN [facility] SERVICES [address] [place], [place], [address] Phone: [phone]   Fax:  [phone]   Name: Emaad Nanna MRN: [id number] Date of Birth: [birth year]

## 2019-12-04 NOTE — Therapy (Signed)
[place] MAIN [facility] SERVICES [place], [place], [address] Phone: [phone]   Fax:  [phone]  Physical Therapy Treatment Physical Therapy Progress Note   Dates of reporting period 10/11/19 to 12/04/19  Patient Details  Name: Dwayne Robinson MRN: [id number] Date of Birth: 1936-08-19 Referring Provider (PT): Jennings Books   Encounter Date: 12/04/2019  PT End of Session - 12/04/19 1110    Visit Number  10    Number of Visits  17    Date for PT Re-Evaluation  12/06/19    PT Start Time  1100    PT Stop Time  1145    PT Time Calculation (min)  45 min    Equipment Utilized During Treatment  Gait belt    Activity Tolerance  Patient tolerated treatment well;No increased pain;Patient limited by fatigue    Behavior During Therapy  [facility] for tasks assessed/performed       Past Medical History:  Diagnosis Date  . A-fib ([place])   . Arthritis   . Bleeding disorder ([place])   . GERD (gastroesophageal reflux disease)   . Heart attack ([place])   . Heart disease   . Heart failure ([place])   . Heart murmur     Past Surgical History:  Procedure Laterality Date  . CARDIAC SURGERY    . CARDIAC VALVE SURGERY      There were no vitals filed for this visit.  Subjective Assessment - 12/04/19 1109    Subjective  Pt doing well. He reports that he is sleeping a little better.    Pertinent History  Patient has had foot neuropathy for 15 years ago. He says that both feet are cold all the way to the knees. He does not use an AD. He has difficutly with steps and climbing up a ladder. He has had some falls several months ago.    Limitations  Standing    How long can you stand comfortably?  15 mins    How long can you walk comfortably?  15 mins    Patient Stated Goals  Patient wants to get better.    Currently in Pain?  No/denies    Pain Score  0-No pain    Pain Onset  More than a month ago         [facility] PT Assessment - 12/04/19 0001      Standardized Balance Assessment   Standardized Balance Assessment  Berg Balance Test      Berg Balance Test   Sit to Stand  Able to stand without using hands and stabilize independently    Standing Unsupported  Able to stand safely 2 minutes    Sitting with Back Unsupported but Feet Supported on Floor or Stool  Able to sit safely and securely 2 minutes    Stand to Sit  Sits safely with minimal use of hands    Transfers  Able to transfer safely, minor use of hands    Standing Unsupported with Eyes Closed  Able to stand 10 seconds safely    Standing Unsupported with Feet Together  Able to place feet together independently and stand for 1 minute with supervision    From Standing, Reach Forward with Outstretched Arm  Can reach forward >5 cm safely (2")    From Standing Position, Pick up Object from [place] to pick up shoe safely and easily    From Standing Position, Turn to Look Behind Over each Shoulder  Looks behind one  side only/other side shows less weight shift    Turn 360 Degrees  Able to turn 360 degrees safely one side only in 4 seconds or less    Standing Unsupported, Alternately Place Feet on Step/Stool  Able to stand independently and complete 8 steps >20 seconds    Standing Unsupported, One Foot in Front  Able to take small step independently and hold 30 seconds    Standing on One Leg  Tries to lift leg/unable to hold 3 seconds but remains standing independently    Total Score  45       Treatment: Reviewed outcome measures including : BERG balance test 10 MW test 5 x sit to stand 6 MW test  Patient performed with instruction, verbal cues, tactile cues of therapist: goal: increase tissue extensibility, promote proper posture, improve mobility                    PT Education - 12/04/19 1109    Education Details  HEP    Person(s) Educated  Patient    Methods  Explanation    Comprehension  Verbalized understanding;Returned demonstration;Need further  instruction       PT Short Term Goals - 12/04/19 1111      PT SHORT TERM GOAL #1   Title  Patient will be independent in home exercise program to improve strength/mobility for better functional independence with ADLs.    Time  8    Period  Weeks    Status  Achieved    Target Date  11/08/19        PT Long Term Goals - 12/04/19 1111      PT LONG TERM GOAL #1   Title  Patient (> 30 years old) will complete five times sit to stand test in < 15 seconds indicating an increased LE strength and improved balance.    Baseline  12/04/19=13.44    Time  8    Period  Weeks    Status  Partially Met    Target Date  01/29/20      PT LONG TERM GOAL #2   Title  Patient will increase Berg Balance score by > 6 points to demonstrate decreased fall risk during functional activities.    Baseline  12/04/19=45/56    Time  8    Period  Weeks    Status  Partially Met    Target Date  01/29/20      PT LONG TERM GOAL #3   Title  Patient will increase six minute walk test distance to >1000 for progression to community ambulator and improve gait ability    Baseline  12/04/19=850 ft    Time  8    Period  Weeks    Status  Partially Met    Target Date  01/29/20      PT LONG TERM GOAL #4   Title  Patient will increase 10 meter walk test to >1.96ms as to improve gait speed for better community ambulation and to reduce fall risk.    Baseline  12/04/19=. 90 m/sec    Time  8    Period  Weeks    Status  Partially Met    Target Date  01/29/20            Plan - 12/04/19 1110    Clinical Impression Statement  Patient's condition has the potential to improve in response to therapy. Maximum improvement is yet to be obtained. The anticipated improvement is attainable and reasonable in  a generally predictable time.  Patient reports that he feet feel like he is walking in ice water. He is making progress in all LTG's. He will continue to benefit from skilled PT to improve mobility and balance and safety.     Personal Factors and Comorbidities  Age    Examination-Activity Limitations  Engineer, water Overhead    Examination-Participation Restrictions  Community Activity    Rehab Potential  Good    PT Frequency  2x / week    PT Duration  8 weeks    PT Treatment/Interventions  Manual techniques;Neuromuscular re-education;Balance training;Therapeutic exercise;Functional mobility training;Therapeutic activities    PT Next Visit Plan  Continue to progress balance training    PT Home Exercise Plan  No updates this vsisit; encourage compliance    Consulted and Agree with Plan of Care  Patient       Patient will benefit from skilled therapeutic intervention in order to improve the following deficits and impairments:  Abnormal gait, Decreased strength, Difficulty walking, Decreased balance, Pain, Decreased mobility  Visit Diagnosis: Cognitive communication deficit  Difficulty in walking, not elsewhere classified  Other abnormalities of gait and mobility  Muscle weakness (generalized)     Problem List Patient Active Problem List   Diagnosis Date Noted  . Renal mass 01/25/2019  . History of MI (myocardial infarction) 09/23/2018  . LVH (left ventricular hypertrophy) due to hypertensive disease, without heart failure 08/08/2018  . Dizziness 12/02/2017  . SOBOE (shortness of breath on exertion) 10/13/2017  . Atrial flutter, paroxysmal ([place]) 12/02/2016  . CAD (coronary artery disease) 10/17/2014  . Atherosclerosis of both carotid arteries 06/27/2014  . Benign essential HTN 06/27/2014  . Chronic systolic CHF (congestive heart failure), NYHA class 3 ([place]) 06/27/2014  . Mixed hyperlipidemia 06/27/2014  . Paroxysmal A-fib ([place]) 06/27/2014  . Severe mitral insufficiency 06/27/2014  . Elevated prostate specific antigen (PSA) 07/05/2012  . Enlarged prostate with lower urinary tract symptoms (LUTS) 07/05/2012  . Microscopic hematuria 07/05/2012    Alanson Puls , PT DPT 12/04/2019,  11:32 AM  [place] MAIN [facility] SERVICES [address] [place], [place], [address] Phone: [phone]   Fax:  [phone]  Name: Dwayne Robinson MRN: [id number] Date of Birth: 10-25-1935

## 2019-12-07 ENCOUNTER — Encounter: Payer: Medicare PPO | Admitting: Speech Pathology

## 2019-12-11 ENCOUNTER — Ambulatory Visit: Payer: Medicare PPO | Attending: Neurology

## 2019-12-11 ENCOUNTER — Other Ambulatory Visit: Payer: Self-pay

## 2019-12-11 ENCOUNTER — Encounter: Payer: Medicare PPO | Admitting: Speech Pathology

## 2019-12-11 DIAGNOSIS — R262 Difficulty in walking, not elsewhere classified: Secondary | ICD-10-CM | POA: Diagnosis present

## 2019-12-11 DIAGNOSIS — R41841 Cognitive communication deficit: Secondary | ICD-10-CM | POA: Insufficient documentation

## 2019-12-11 DIAGNOSIS — R2689 Other abnormalities of gait and mobility: Secondary | ICD-10-CM

## 2019-12-11 DIAGNOSIS — M6281 Muscle weakness (generalized): Secondary | ICD-10-CM | POA: Diagnosis present

## 2019-12-11 NOTE — Therapy (Signed)
[place] MAIN [facility] SERVICES [address] [place], [place], [address] Phone: [phone]   Fax:  [phone]  Physical Therapy Treatment/ RECERT  Patient Details  Name: Dwayne Robinson MRN: [id number] Date of Birth: Aug 13, 1936 Referring Provider (PT): Jennings Books   Encounter Date: 12/11/2019  PT End of Session - 12/11/19 1021    Visit Number  11    Number of Visits  27    Date for PT Re-Evaluation  02/05/20    PT Start Time  9458    PT Stop Time  1059    PT Time Calculation (min)  44 min    Equipment Utilized During Treatment  Gait belt    Activity Tolerance  Patient tolerated treatment well;No increased pain;Patient limited by fatigue    Behavior During Therapy  [facility] for tasks assessed/performed       Past Medical History:  Diagnosis Date  . A-fib ([place])   . Arthritis   . Bleeding disorder ([place])   . GERD (gastroesophageal reflux disease)   . Heart attack ([place])   . Heart disease   . Heart failure ([place])   . Heart murmur     Past Surgical History:  Procedure Laterality Date  . CARDIAC SURGERY    . CARDIAC VALVE SURGERY      There were no vitals filed for this visit.  Subjective Assessment - 12/11/19 1019    Subjective  Patient reports he mixed up his time and came an hour early. Feels stiff from waiting. Feels like his balance is improving but not where he wants it to be.    Pertinent History  Patient has had foot neuropathy for 15 years ago. He says that both feet are cold all the way to the knees. He does not use an AD. He has difficutly with steps and climbing up a ladder. He has had some falls several months ago.    Limitations  Standing    How long can you stand comfortably?  15 mins    How long can you walk comfortably?  15 mins    Patient Stated Goals  Patient wants to get better.    Currently in Pain?  No/denies            RECERT Just did goals 5/92/92   Treatment: [place] fitness x 5 mins; Level 4-5   Neuromuscular Re-education  [place] step over fwd/bwd 10x each LE ; BUE support  [place] side to side step over 10x each LE; BUE support Heel/toe raises 15x airex pad 6' step modified tandem stance hold 30-45 seconds each position x 2 trials each LE placement airex pad 3lb bar straight arm raise 15x airex pad: eyes closed static stand 30 seconds x 2 trials.   Ther-ex  Hip flexion marches with 4# ankle weights x 15 bilateral; Hip abduction with 4# x 12 bilateral Hip extension with 4# x 12 bilateral Lunges to BOSU ball x 15 BLE; SUE support. Max cueing for body mechanics Sit to stand from standard heigh chair 10x, no UE support.   lateral squat walks with UE support on outside of // bars, 4x length, cues for depth of squat  Patient needs occasional verbal cueing to improve posture and cueing to correctly perform exercises slowly, holding at end of range to increase motor firing of desired muscle to encourage fatigue.   Pt educated throughout session about proper posture and technique with exercises. Improved exercise technique, movement at target joints, use of  target muscles after min to mod verbal, visual, tactile cues.   CGA and Min to mod verbal cues used throughout with increased in postural sway and LOB most seen with narrow base of support and while on uneven surfaces.                   PT Education - 12/11/19 1020    Education Details  exercise technique, body mechanics, RECERT    Person(s) Educated  Patient    Methods  Explanation;Demonstration;Tactile cues;Verbal cues    Comprehension  Verbalized understanding;Returned demonstration;Verbal cues required;Tactile cues required       PT Short Term Goals - 12/11/19 1059      PT SHORT TERM GOAL #1   Title  Patient will be independent in home exercise program to improve strength/mobility for better functional independence with ADLs.    Baseline  HEP compliant    Time  8    Period  Weeks    Status   Achieved    Target Date  11/08/19        PT Long Term Goals - 12/11/19 1059      PT LONG TERM GOAL #1   Title  Patient (> 20 years old) will complete five times sit to stand test in < 15 seconds indicating an increased LE strength and improved balance.    Baseline  12/04/19=13.44    Time  8    Period  Weeks    Status  Partially Met    Target Date  02/05/20      PT LONG TERM GOAL #2   Title  Patient will increase Berg Balance score by > 6 points to demonstrate decreased fall risk during functional activities.    Baseline  12/04/19=45/56    Time  8    Period  Weeks    Status  Partially Met    Target Date  02/05/20      PT LONG TERM GOAL #3   Title  Patient will increase six minute walk test distance to >1000 for progression to community ambulator and improve gait ability    Baseline  12/04/19=850 ft    Time  8    Period  Weeks    Status  Partially Met    Target Date  02/05/20      PT LONG TERM GOAL #4   Title  Patient will increase 10 meter walk test to >1.26ms as to improve gait speed for better community ambulation and to reduce fall risk.    Baseline  12/04/19=. 90 m/sec    Time  8    Period  Weeks    Status  Partially Met    Target Date  02/05/20            Plan - 12/11/19 1059    Clinical Impression Statement  Patient's goals performed on 12/04/19, please refer to this note for further details. Unstable surfaces are challenging for patient however repetition results in improved ankle righting reactions. Forward trunk lean limits posture resulting in more anterior COM. He will continue to benefit from skilled PT to improve mobility and balance and safety.    Personal Factors and Comorbidities  Age    Examination-Activity Limitations  LEngineer, waterOverhead    Examination-Participation Restrictions  Community Activity    Rehab Potential  Good    PT Frequency  2x / week    PT Duration  8 weeks    PT Treatment/Interventions  Manual techniques;Neuromuscular  re-education;Balance training;Therapeutic exercise;Functional  mobility training;Therapeutic activities    PT Next Visit Plan  Continue to progress balance training    PT Home Exercise Plan  No updates this vsisit; encourage compliance    Consulted and Agree with Plan of Care  Patient       Patient will benefit from skilled therapeutic intervention in order to improve the following deficits and impairments:  Abnormal gait, Decreased strength, Difficulty walking, Decreased balance, Pain, Decreased mobility  Visit Diagnosis: Difficulty in walking, not elsewhere classified  Other abnormalities of gait and mobility  Muscle weakness (generalized)     Problem List Patient Active Problem List   Diagnosis Date Noted  . Renal mass 01/25/2019  . History of MI (myocardial infarction) 09/23/2018  . LVH (left ventricular hypertrophy) due to hypertensive disease, without heart failure 08/08/2018  . Dizziness 12/02/2017  . SOBOE (shortness of breath on exertion) 10/13/2017  . Atrial flutter, paroxysmal ([place]) 12/02/2016  . CAD (coronary artery disease) 10/17/2014  . Atherosclerosis of both carotid arteries 06/27/2014  . Benign essential HTN 06/27/2014  . Chronic systolic CHF (congestive heart failure), NYHA class 3 ([place]) 06/27/2014  . Mixed hyperlipidemia 06/27/2014  . Paroxysmal A-fib ([place]) 06/27/2014  . Severe mitral insufficiency 06/27/2014  . Elevated prostate specific antigen (PSA) 07/05/2012  . Enlarged prostate with lower urinary tract symptoms (LUTS) 07/05/2012  . Microscopic hematuria 07/05/2012   Janna Arch, PT, DPT   12/11/2019, 11:00 AM  [place] MAIN [facility] SERVICES [place], [place], [address] Phone: [phone]   Fax:  [phone]  Name: Dwayne Robinson MRN: [id number] Date of Birth: Oct 16, 1935

## 2019-12-14 ENCOUNTER — Ambulatory Visit: Payer: Medicare PPO | Admitting: Physical Therapy

## 2019-12-14 ENCOUNTER — Other Ambulatory Visit: Payer: Self-pay

## 2019-12-14 ENCOUNTER — Encounter: Payer: Self-pay | Admitting: Physical Therapy

## 2019-12-14 DIAGNOSIS — R41841 Cognitive communication deficit: Secondary | ICD-10-CM

## 2019-12-14 DIAGNOSIS — M6281 Muscle weakness (generalized): Secondary | ICD-10-CM

## 2019-12-14 DIAGNOSIS — R262 Difficulty in walking, not elsewhere classified: Secondary | ICD-10-CM

## 2019-12-14 DIAGNOSIS — R2689 Other abnormalities of gait and mobility: Secondary | ICD-10-CM

## 2019-12-14 NOTE — Therapy (Signed)
[place] MAIN [facility] SERVICES [address] [place], [place], [address] Phone: [phone]   Fax:  [phone]  Physical Therapy Treatment  Patient Details  Name: Dwayne Robinson MRN: [id number] Date of Birth: [birth year] Referring Provider (PT): Jennings Books   Encounter Date: 12/14/2019  PT End of Session - 12/14/19 0936    Visit Number  12    Number of Visits  27    Date for PT Re-Evaluation  02/05/20    PT Start Time  0932    PT Stop Time  1010    PT Time Calculation (min)  38 min    Equipment Utilized During Treatment  Gait belt    Activity Tolerance  Patient tolerated treatment well;No increased pain;Patient limited by fatigue    Behavior During Therapy  [facility] for tasks assessed/performed       Past Medical History:  Diagnosis Date  . A-fib ([place])   . Arthritis   . Bleeding disorder ([place])   . GERD (gastroesophageal reflux disease)   . Heart attack ([place])   . Heart disease   . Heart failure ([place])   . Heart murmur     Past Surgical History:  Procedure Laterality Date  . CARDIAC SURGERY    . CARDIAC VALVE SURGERY      There were no vitals filed for this visit.  Subjective Assessment - 12/14/19 0934    Subjective  Patient has no new concerns. No pain today.    Pertinent History  Patient has had foot neuropathy for 15 years ago. He says that both feet are cold all the way to the knees. He does not use an AD. He has difficutly with steps and climbing up a ladder. He has had some falls several months ago.    Limitations  Standing    How long can you stand comfortably?  15 mins    How long can you walk comfortably?  15 mins    Patient Stated Goals  Patient wants to get better.    Currently in Pain?  No/denies    Pain Score  0-No pain    Pain Onset  More than a month ago      Treatment: Octane fitness x 5 mins L 6  Ther-ex  Lunges to BOSU ball x 15 BLE Heel raises x 15 x 2 sets Step ups to 6-inch stool x 20  Eccentric  step downs from 3 inch stool x 10 verbal cues to complete slow and tap heel.  BUE CGA Patient needs occasional verbal cueing to improve posture and cueing to correctly perform exercises slowly, holding at end of range to increase motor firing of desired muscle to encourage fatigue.  Neuromuscular Re-education  Tandem stand on blue foam without UE support x 2 mins Side stepping on blue foam without UE support x 2 lengths Heel/toe raises without UE support 3s hold x 10 each 1/2 foam roll balance with flat side up 30s x 2 reps 1/2 foam roll balance with flat side down 30s x 2 reps Lateral side steps from foam to 6 inch stool left and right x 15 Backwards stepping from foam to 6 inch stool x 15  [place] fwd/bwd, side to side , diagonal x 10 ,cues for posture and stepping strategies, occasional LOB Star stepping left and right x 10       Pt educated throughout session about proper posture and technique with exercises. Improved exercise technique, movement at target joints, use of  target muscles after min to mod verbal, visual, tactile cues. CGA and Min to mod verbal cues used throughout with increased in postural sway and LOB most seen with narrow base of support and while on uneven surfaces.           PT Education - 12/14/19 0936    Education Details  HEP    Person(s) Educated  Patient    Methods  Explanation    Comprehension  Verbalized understanding;Returned demonstration;Need further instruction       PT Short Term Goals - 12/11/19 1059      PT SHORT TERM GOAL #1   Title  Patient will be independent in home exercise program to improve strength/mobility for better functional independence with ADLs.    Baseline  HEP compliant    Time  8    Period  Weeks    Status  Achieved    Target Date  11/08/19        PT Long Term Goals - 12/11/19 1059      PT LONG TERM GOAL #1   Title  Patient (> 82 years old) will complete five times sit to stand test in < 15 seconds  indicating an increased LE strength and improved balance.    Baseline  12/04/19=13.44    Time  8    Period  Weeks    Status  Partially Met    Target Date  02/05/20      PT LONG TERM GOAL #2   Title  Patient will increase Berg Balance score by > 6 points to demonstrate decreased fall risk during functional activities.    Baseline  12/04/19=45/56    Time  8    Period  Weeks    Status  Partially Met    Target Date  02/05/20      PT LONG TERM GOAL #3   Title  Patient will increase six minute walk test distance to >1000 for progression to community ambulator and improve gait ability    Baseline  12/04/19=850 ft    Time  8    Period  Weeks    Status  Partially Met    Target Date  02/05/20      PT LONG TERM GOAL #4   Title  Patient will increase 10 meter walk test to >1.87ms as to improve gait speed for better community ambulation and to reduce fall risk.    Baseline  12/04/19=. 90 m/sec    Time  8    Period  Weeks    Status  Partially Met    Target Date  02/05/20            Plan - 12/14/19 01443   Clinical Impression Statement  Patient demonstrates deficits with postural control in tandem and narrow stance on purple foam with head turns. Patient demonstrated hesitation with full weight shifting during lunge but minimal cueing resulted in good technique and no LOB.  Patient improved ability to challenge dynamic balance with supervision and without UE assist today.  Patient shows good BLE strength and hip control during lunge. Patient will continue to benefit from skilled physical therapy to improve endurance and dynamic balance to reduce fall risk.    Personal Factors and Comorbidities  Age    Examination-Activity Limitations  Locomotion Level;Reach Overhead    Examination-Participation Restrictions  Community Activity    Rehab Potential  Good    PT Frequency  2x / week    PT Duration  8 weeks  PT Treatment/Interventions  Manual techniques;Neuromuscular re-education;Balance  training;Therapeutic exercise;Functional mobility training;Therapeutic activities    PT Next Visit Plan  Continue to progress balance training    PT Home Exercise Plan  No updates this vsisit; encourage compliance    Consulted and Agree with Plan of Care  Patient       Patient will benefit from skilled therapeutic intervention in order to improve the following deficits and impairments:  Abnormal gait, Decreased strength, Difficulty walking, Decreased balance, Pain, Decreased mobility  Visit Diagnosis: Difficulty in walking, not elsewhere classified  Other abnormalities of gait and mobility  Muscle weakness (generalized)  Cognitive communication deficit     Problem List Patient Active Problem List   Diagnosis Date Noted  . Renal mass 01/25/2019  . History of MI (myocardial infarction) 09/23/2018  . LVH (left ventricular hypertrophy) due to hypertensive disease, without heart failure 08/08/2018  . Dizziness 12/02/2017  . SOBOE (shortness of breath on exertion) 10/13/2017  . Atrial flutter, paroxysmal ([place]) 12/02/2016  . CAD (coronary artery disease) 10/17/2014  . Atherosclerosis of both carotid arteries 06/27/2014  . Benign essential HTN 06/27/2014  . Chronic systolic CHF (congestive heart failure), NYHA class 3 ([place]) 06/27/2014  . Mixed hyperlipidemia 06/27/2014  . Paroxysmal A-fib ([place]) 06/27/2014  . Severe mitral insufficiency 06/27/2014  . Elevated prostate specific antigen (PSA) 07/05/2012  . Enlarged prostate with lower urinary tract symptoms (LUTS) 07/05/2012  . Microscopic hematuria 07/05/2012    Alanson Puls, [place] 12/14/2019, 9:44 AM  [place] MAIN [facility] SERVICES [address] [place], [place], [address] Phone: [phone]   Fax:  [phone]  Name: Denzal Meir MRN: [id number] Date of Birth: 1936/07/09

## 2019-12-18 ENCOUNTER — Encounter: Payer: Self-pay | Admitting: Physical Therapy

## 2019-12-18 ENCOUNTER — Other Ambulatory Visit: Payer: Self-pay

## 2019-12-18 ENCOUNTER — Ambulatory Visit: Payer: Medicare PPO | Admitting: Physical Therapy

## 2019-12-18 DIAGNOSIS — R2689 Other abnormalities of gait and mobility: Secondary | ICD-10-CM

## 2019-12-18 DIAGNOSIS — M6281 Muscle weakness (generalized): Secondary | ICD-10-CM

## 2019-12-18 DIAGNOSIS — R262 Difficulty in walking, not elsewhere classified: Secondary | ICD-10-CM

## 2019-12-18 NOTE — Therapy (Signed)
[place] MAIN [facility] SERVICES [address] [place], [place], [address] Phone: [phone]   Fax:  [phone]  Physical Therapy Treatment  Patient Details  Name: Dwayne Robinson MRN: [id number] Date of Birth: 1936-01-13 Referring Provider (PT): Jennings Books   Encounter Date: 12/18/2019  PT End of Session - 12/18/19 1013    Visit Number  13    Number of Visits  27    Date for PT Re-Evaluation  02/05/20    PT Start Time  1712    PT Stop Time  1055    PT Time Calculation (min)  40 min    Equipment Utilized During Treatment  Gait belt    Activity Tolerance  Patient tolerated treatment well;No increased pain;Patient limited by fatigue    Behavior During Therapy  [facility] for tasks assessed/performed       Past Medical History:  Diagnosis Date  . A-fib ([place])   . Arthritis   . Bleeding disorder ([place])   . GERD (gastroesophageal reflux disease)   . Heart attack ([place])   . Heart disease   . Heart failure ([place])   . Heart murmur     Past Surgical History:  Procedure Laterality Date  . CARDIAC SURGERY    . CARDIAC VALVE SURGERY      There were no vitals filed for this visit.   Ther-ex  Octane fitness x 5 mins  Hip flexion marches with 2# ankle weights x 10 bilateral; Hip abduction with 2# x 10 bilateral; HS curls with 2# AW x 10 bilateral; Hip extension with 2# x 10 bilateral; Quantum leg press 40# x 20 x 3 sets  Sit to stand without UE support from regular height chair x 10   Squats x 15 with cues for correct posture Lunges to BOSU ball x 15 BLE High marching x 20 BLE  Step ups to 6-inch stool x 20  Eccentric step downs from 3 inch stool x 10 verbal cues to complete slow and tap heel.  BUE CGA  Neuromuscular Re-education  1/2 foam roll balance with flat side up 30s x 2 reps 1/2 foam roll balance with flat side down 30s x 2 reps 1/2 foam roll tandem balance alternating forward LE 30s x 2 each LE forward Lateral side steps from  foam to 6 inch stool left and right x 15 Backwards stepping from foam to 6 inch stool x 15        Pt educated throughout session about proper posture and technique with exercises. Improved exercise technique, movement at target joints, use of target muscles after min to mod verbal, visual, tactile cues. CGA and Min to mod verbal cues used throughout with increased in postural sway and LOB most seen with narrow base of support and while on uneven surfaces. Continues to have balance deficits typical with diagnosis.                         PT Education - 12/18/19 1013    Education Details  HEP    Person(s) Educated  Patient    Methods  Explanation    Comprehension  Verbalized understanding;Returned demonstration;Verbal cues required;Tactile cues required;Need further instruction       PT Short Term Goals - 12/11/19 1059      PT SHORT TERM GOAL #1   Title  Patient will be independent in home exercise program to improve strength/mobility for better functional independence with ADLs.  Baseline  HEP compliant    Time  8    Period  Weeks    Status  Achieved    Target Date  11/08/19        PT Long Term Goals - 12/11/19 1059      PT LONG TERM GOAL #1   Title  Patient (> 39 years old) will complete five times sit to stand test in < 15 seconds indicating an increased LE strength and improved balance.    Baseline  12/04/19=13.44    Time  8    Period  Weeks    Status  Partially Met    Target Date  02/05/20      PT LONG TERM GOAL #2   Title  Patient will increase Berg Balance score by > 6 points to demonstrate decreased fall risk during functional activities.    Baseline  12/04/19=45/56    Time  8    Period  Weeks    Status  Partially Met    Target Date  02/05/20      PT LONG TERM GOAL #3   Title  Patient will increase six minute walk test distance to >1000 for progression to community ambulator and improve gait ability    Baseline  12/04/19=850 ft    Time  8     Period  Weeks    Status  Partially Met    Target Date  02/05/20      PT LONG TERM GOAL #4   Title  Patient will increase 10 meter walk test to >1.75ms as to improve gait speed for better community ambulation and to reduce fall risk.    Baseline  12/04/19=. 90 m/sec    Time  8    Period  Weeks    Status  Partially Met    Target Date  02/05/20            Plan - 12/18/19 1014    Clinical Impression Statement  Patient instructed in intermediate balance challenges. Patient required min-mod VCs for correct positioning; Patient had increased difficulty with dynamic steps and dule task movements especially unsupported. Patient would benefit from additional skilled PT intervention to improve strength, balance.   Personal Factors and Comorbidities  Age    Examination-Activity Limitations  LEngineer, waterOverhead    Examination-Participation Restrictions  Community Activity    Rehab Potential  Good    PT Frequency  2x / week    PT Duration  8 weeks    PT Treatment/Interventions  Manual techniques;Neuromuscular re-education;Balance training;Therapeutic exercise;Functional mobility training;Therapeutic activities    PT Next Visit Plan  Continue to progress balance training    PT Home Exercise Plan  No updates this vsisit; encourage compliance    Consulted and Agree with Plan of Care  Patient       Patient will benefit from skilled therapeutic intervention in order to improve the following deficits and impairments:  Abnormal gait, Decreased strength, Difficulty walking, Decreased balance, Pain, Decreased mobility  Visit Diagnosis: Difficulty in walking, not elsewhere classified  Other abnormalities of gait and mobility  Muscle weakness (generalized)     Problem List Patient Active Problem List   Diagnosis Date Noted  . Renal mass 01/25/2019  . History of MI (myocardial infarction) 09/23/2018  . LVH (left ventricular hypertrophy) due to hypertensive disease, without  heart failure 08/08/2018  . Dizziness 12/02/2017  . SOBOE (shortness of breath on exertion) 10/13/2017  . Atrial flutter, paroxysmal ([place] 12/02/2016  . CAD (coronary artery  disease) 10/17/2014  . Atherosclerosis of both carotid arteries 06/27/2014  . Benign essential HTN 06/27/2014  . Chronic systolic CHF (congestive heart failure), NYHA class 3 ([place]) 06/27/2014  . Mixed hyperlipidemia 06/27/2014  . Paroxysmal A-fib ([place]) 06/27/2014  . Severe mitral insufficiency 06/27/2014  . Elevated prostate specific antigen (PSA) 07/05/2012  . Enlarged prostate with lower urinary tract symptoms (LUTS) 07/05/2012  . Microscopic hematuria 07/05/2012    Alanson Puls, PT DPT 12/18/2019, 10:24 AM  [place] MAIN [facility] SERVICES [address] [place], [place], [address] Phone: [phone]   Fax:  [phone]  Name: Dwayne Robinson MRN: [id number] Date of Birth: 10-Jul-1936

## 2019-12-21 ENCOUNTER — Ambulatory Visit: Payer: Medicare PPO | Admitting: Physical Therapy

## 2019-12-25 ENCOUNTER — Other Ambulatory Visit: Payer: Self-pay

## 2019-12-25 ENCOUNTER — Encounter: Payer: Self-pay | Admitting: Emergency Medicine

## 2019-12-25 ENCOUNTER — Encounter: Payer: Self-pay | Admitting: Physical Therapy

## 2019-12-25 ENCOUNTER — Ambulatory Visit: Payer: Medicare PPO | Admitting: Physical Therapy

## 2019-12-25 ENCOUNTER — Emergency Department: Payer: Medicare PPO

## 2019-12-25 ENCOUNTER — Emergency Department
Admission: EM | Admit: 2019-12-25 | Discharge: 2019-12-25 | Disposition: A | Discharge status: Home / Self Care | Payer: Medicare PPO | Attending: Emergency Medicine | Admitting: Emergency Medicine

## 2019-12-25 VITALS — BP 151/52 | HR 55

## 2019-12-25 DIAGNOSIS — M6281 Muscle weakness (generalized): Secondary | ICD-10-CM

## 2019-12-25 DIAGNOSIS — Z5321 Procedure and treatment not carried out due to patient leaving prior to being seen by health care provider: Secondary | ICD-10-CM | POA: Insufficient documentation

## 2019-12-25 DIAGNOSIS — R262 Difficulty in walking, not elsewhere classified: Secondary | ICD-10-CM

## 2019-12-25 DIAGNOSIS — R2689 Other abnormalities of gait and mobility: Secondary | ICD-10-CM

## 2019-12-25 DIAGNOSIS — R079 Chest pain, unspecified: Secondary | ICD-10-CM | POA: Insufficient documentation

## 2019-12-25 DIAGNOSIS — R41841 Cognitive communication deficit: Secondary | ICD-10-CM

## 2019-12-25 LAB — CBC
HCT: 48.7 % (ref 39.0–52.0)
Hemoglobin: 16.1 g/dL (ref 13.0–17.0)
MCH: 29 pg (ref 26.0–34.0)
MCHC: 33.1 g/dL (ref 30.0–36.0)
MCV: 87.6 fL (ref 80.0–100.0)
Platelets: 208 10*3/uL (ref 150–400)
RBC: 5.56 MIL/uL (ref 4.22–5.81)
RDW: 13.4 % (ref 11.5–15.5)
WBC: 9.7 10*3/uL (ref 4.0–10.5)
nRBC: 0 % (ref 0.0–0.2)

## 2019-12-25 LAB — BASIC METABOLIC PANEL
Anion gap: 7 (ref 5–15)
BUN: 20 mg/dL (ref 8–23)
CO2: 27 mmol/L (ref 22–32)
Calcium: 9.5 mg/dL (ref 8.9–10.3)
Chloride: 104 mmol/L (ref 98–111)
Creatinine, Ser: 1.37 mg/dL — ABNORMAL HIGH (ref 0.61–1.24)
GFR calc Af Amer: 56 mL/min — ABNORMAL LOW (ref >60)
GFR calc non Af Amer: 48 mL/min — ABNORMAL LOW (ref >60)
Glucose, Bld: 121 mg/dL — ABNORMAL HIGH (ref 70–99)
Potassium: 4.5 mmol/L (ref 3.5–5.1)
Sodium: 138 mmol/L (ref 135–145)

## 2019-12-25 LAB — TROPONIN I (HIGH SENSITIVITY): Troponin I (High Sensitivity): 7 ng/L (ref <18)

## 2019-12-25 IMAGING — CR DG CHEST 2V
1 series · 2 of 2 positions shown · non-contrast
Comparison: [DATE]

CLINICAL DATA: Chest pain.

EXAM:
CHEST - 2 VIEW

[Series 1: dg chest 2 view · 0.14mm/px · 2 of 2 slices shown]
[im 1/2]
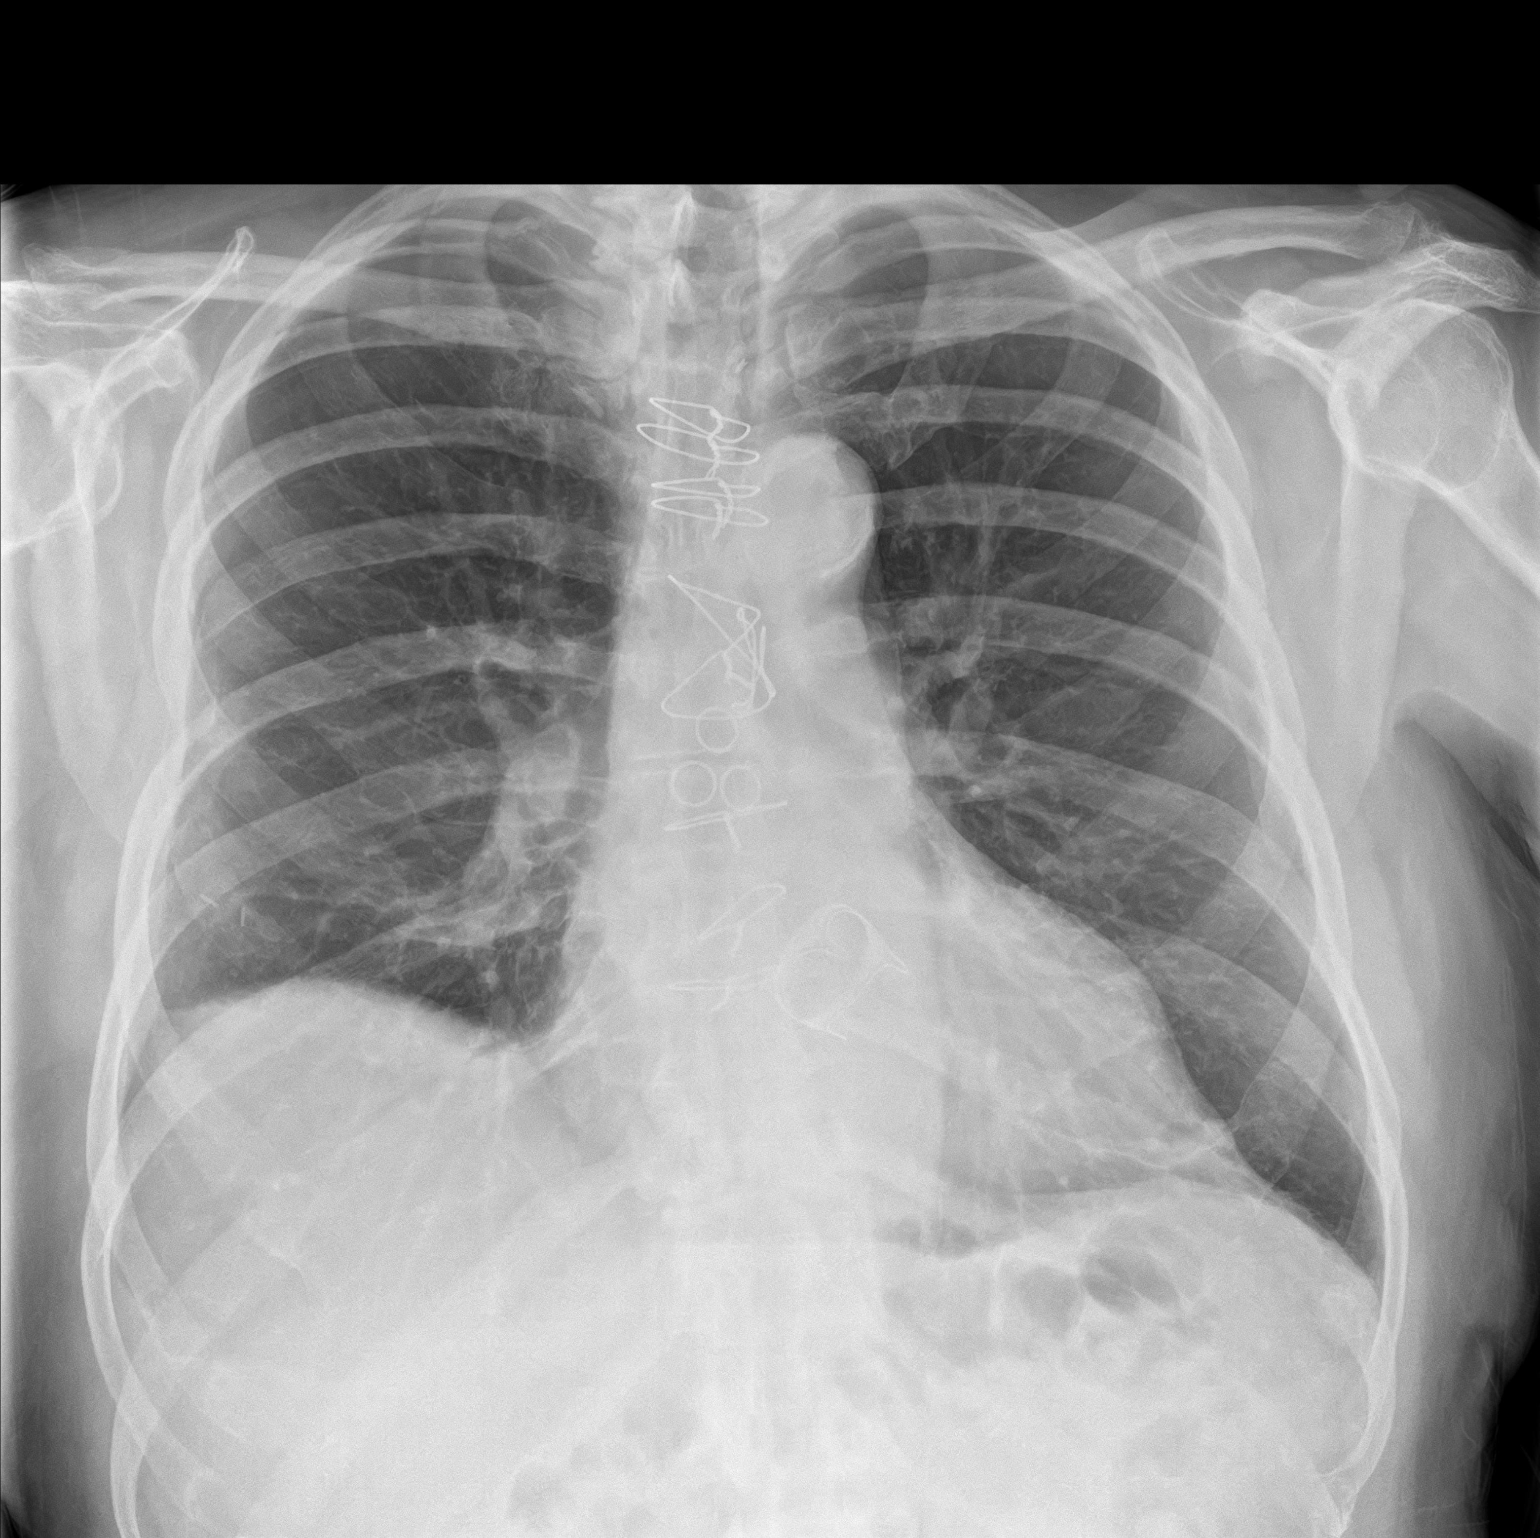
[im 2/2]
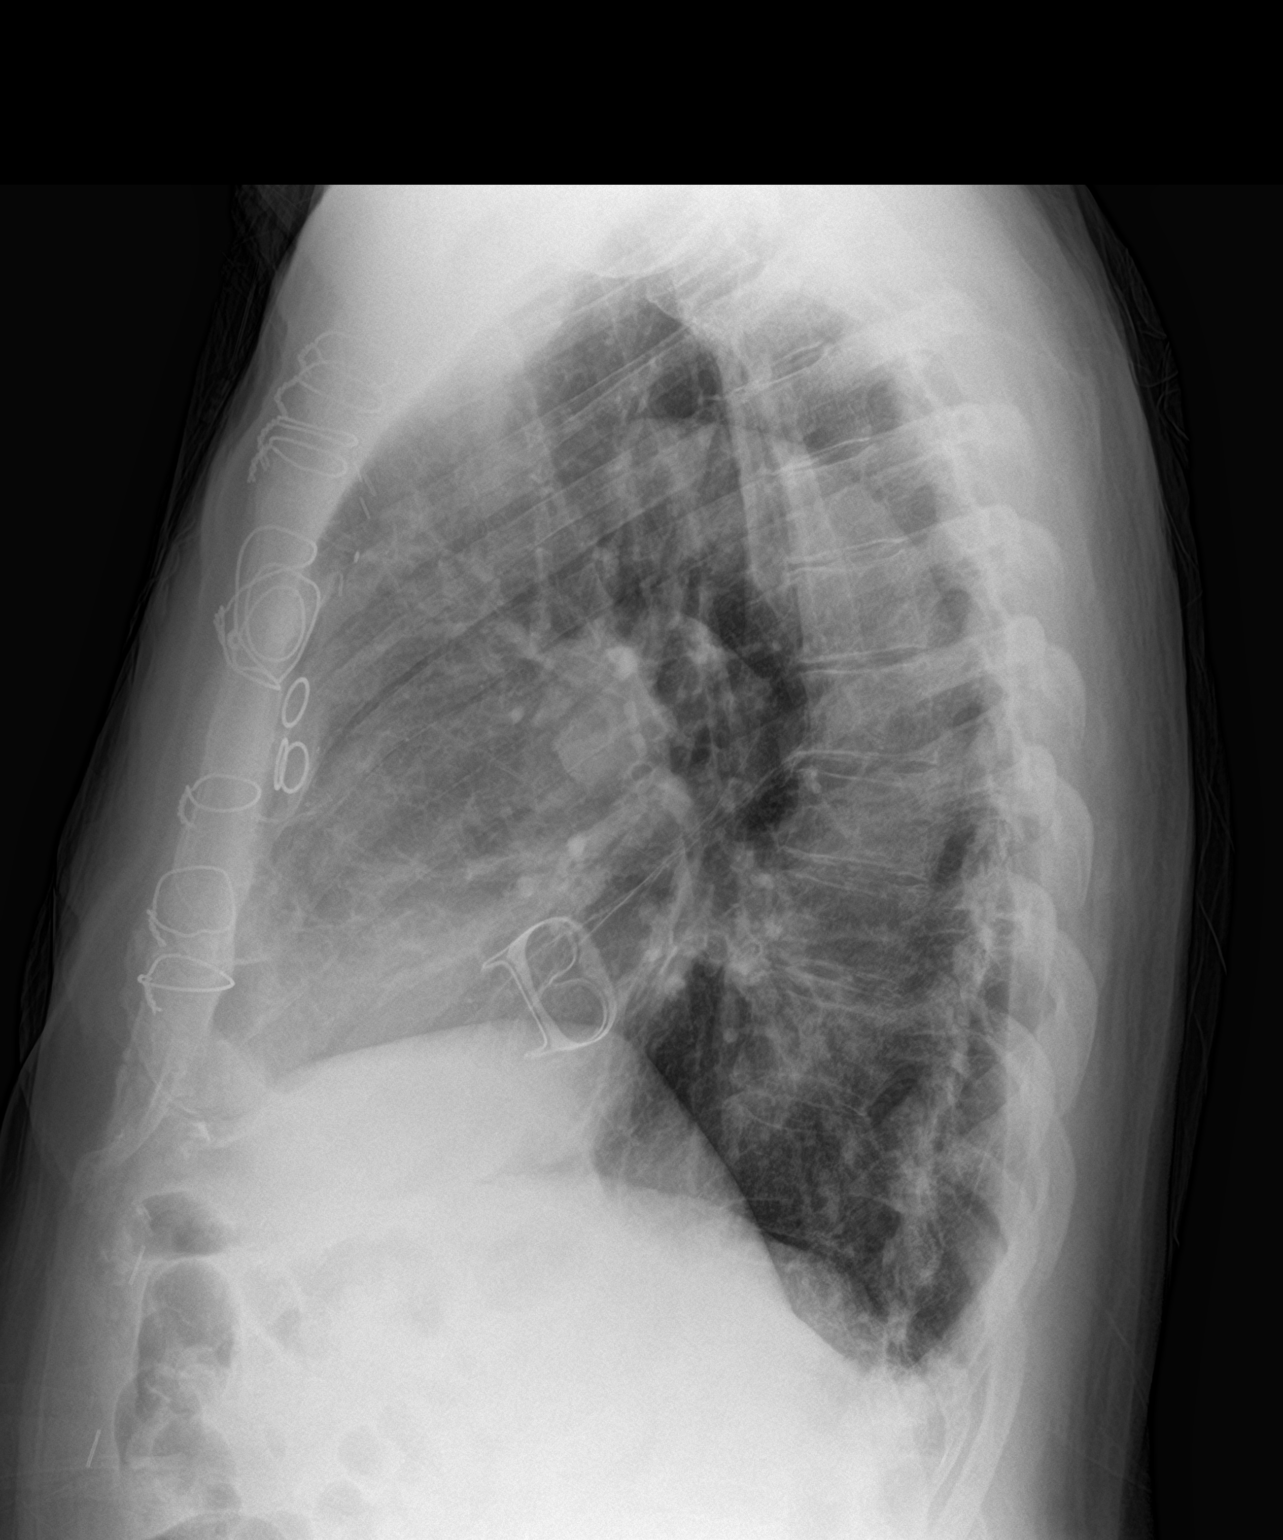

[2 of 2 positions shown; findings below may reference images not displayed]

FINDINGS: The heart size and pulmonary vascularity are normal. Previous CABG
and mitral valve replacement.

Lungs are clear. Chronic elevation of the right hemidiaphragm. No
effusions. No acute bone abnormality. Previous resection of a
portion of the right sixth rib.

Aortic atherosclerosis.
IMPRESSION: 1. No active cardiopulmonary disease.
2. Aortic atherosclerosis.

## 2019-12-25 MED ORDER — SODIUM CHLORIDE 0.9% FLUSH: 3.0000 mL | Freq: Once | INTRAVENOUS | Status: DC

## 2019-12-25 NOTE — ED Triage Notes (Signed)
Pt presents to ED via POV, pt c/o L sided chest pain that is intermittent and has been intermittent x several days. Pt states pain is worse than has been previously pain. Pt states 5-6/10 at this time.

## 2019-12-25 NOTE — Therapy (Signed)
[facility] [place], [place], [address] Phone: [phone]   Fax:  [phone]  Physical Therapy Treatment  Patient Details  Name: Dwayne Robinson MRN: [id number] Date of Birth: May 28, 1936 Referring Provider (Dwayne): Dwayne Robinson   Encounter Date: 12/25/2019    Past Medical History:  Diagnosis Date  . A-fib ([facility] Robinson)   . Arthritis   . Bleeding disorder (Dwayne Robinson)   . GERD (gastroesophageal reflux disease)   . Heart attack (Dwayne Robinson)   . Heart disease   . Heart failure (Dwayne Robinson)   . Heart murmur     Past Surgical History:  Procedure Laterality Date  . CARDIAC SURGERY    . CARDIAC VALVE SURGERY      Vitals:   12/25/19 1124  BP: (!) 151/52  Pulse: (!) 55    Subjective Assessment - 12/25/19 1123    Subjective  Patient reports that his right shoulder and chest has been hurting an feels pressure.    Pertinent History  Patient has had foot neuropathy for 15 years ago. He says that both feet are cold all the way to the knees. He does not use an AD. He has difficutly with steps and climbing up a ladder. He has had some falls several months ago.    Limitations  Standing    How long can you stand comfortably?  15 mins    How long can you walk comfortably?  15 mins    Patient Stated Goals  Patient wants to get better.    Currently in Pain?  Yes    Pain Score  5     Pain Location  Chest    Pain Orientation  Left    Pain Descriptors / Indicators  Aching    Pain Onset  More than a month ago       Patient's vitals were taken and then patient was recommended to go to the Dwayne Robinson.                           Dwayne Short Term Goals - 12/11/19 1059      Dwayne Robinson   Title  Patient will be independent in home exercise program to improve strength/mobility for better functional independence with ADLs.    Baseline  HEP compliant    Time  8    Period  Weeks    Status  Achieved    Target Date   11/08/19        Dwayne Long Term Goals - 12/11/19 1059      Dwayne Robinson   Title  Patient (> 13 years old) will complete five times sit to stand test in < 15 seconds indicating an increased LE strength and improved balance.    Baseline  12/04/19=13.44    Time  8    Period  Weeks    Status  Partially Met    Target Date  02/05/20      Dwayne Robinson   Title  Patient will increase Berg Balance score by > 6 points to demonstrate decreased fall risk during functional activities.    Baseline  12/04/19=45/56    Time  8    Period  Weeks    Status  Partially Met    Target Date  02/05/20      Dwayne Robinson   Title  Patient will increase six minute walk  test distance to >1000 for progression to community ambulator and improve gait ability    Baseline  12/04/19=850 ft    Time  8    Period  Weeks    Status  Partially Met    Target Date  02/05/20      Dwayne Robinson   Title  Patient will increase 10 meter walk test to >1.53ms as to improve gait speed for better community ambulation and to reduce fall risk.    Baseline  12/04/19=. 90 m/sec    Time  8    Period  Weeks    Status  Partially Met    Target Date  02/05/20            Plan - 12/25/19 1125    Clinical Impression Statement  Patient is having chest and left shoulder pain and is recommended to go to the [facility]wayne Robinson.    Personal Factors and Comorbidities  Age    Examination-Activity Limitations  Dwayne Robinson    Examination-Participation Restrictions  Community Activity    Rehab Potential  Good    Dwayne Frequency  2x / week    Dwayne Duration  8 weeks    Dwayne Treatment/Interventions  Manual techniques;Neuromuscular re-education;Balance training;Therapeutic exercise;Functional mobility training;Therapeutic activities    Dwayne Next Visit Plan  Continue to progress balance training    Dwayne Home Exercise Plan  No updates this vsisit; encourage compliance    Consulted and Agree with Plan of Care  Patient        Patient will benefit from skilled therapeutic intervention in order to improve the following deficits and impairments:  Abnormal gait, Decreased strength, Difficulty walking, Decreased balance, Pain, Decreased mobility  Visit Diagnosis: Difficulty in walking, not elsewhere classified  Other abnormalities of gait and mobility  Muscle weakness (generalized)  Cognitive communication deficit     Problem List Patient Active Problem List   Diagnosis Date Noted  . Renal mass 01/25/2019  . History of MI (myocardial infarction) 09/23/2018  . LVH (left ventricular hypertrophy) due to hypertensive disease, without heart failure 08/08/2018  . Dizziness 12/02/2017  . SOBOE (shortness of breath on exertion) 10/13/2017  . Atrial flutter, paroxysmal ([place] 12/02/2016  . CAD (coronary artery disease) 10/17/2014  . Atherosclerosis of both carotid arteries 06/27/2014  . Benign essential HTN 06/27/2014  . Chronic systolic CHF (congestive heart failure), NYHA class 3 ([place] 06/27/2014  . Mixed hyperlipidemia 06/27/2014  . Paroxysmal A-fib ([place] 06/27/2014  . Severe mitral insufficiency 06/27/2014  . Elevated prostate specific antigen (PSA) 07/05/2012  . Enlarged prostate with lower urinary tract symptoms (LUTS) 07/05/2012  . Microscopic hematuria 07/05/2012    Dwayne Robinson Dwayne DPT 12/25/2019, 11:30 AM  [place] [facility] [address] [address]Phone: [phone]  Fax:  [phone] Name: RGavan NordbyMRN: [id number] of Birth: 719-May-1937

## 2019-12-25 NOTE — ED Notes (Signed)
Patient to desk stating he had a ride coming to get him. States his pain is easing and he wants to leave. This RN and Georganna Skeans, patient relations requesting patient to stay to be evaluated but patient continues to refuse. Ambulatory to triage with steady gait and NAD noted.

## 2019-12-28 ENCOUNTER — Ambulatory Visit: Payer: Medicare PPO | Admitting: Physical Therapy

## 2020-01-01 ENCOUNTER — Ambulatory Visit: Payer: Medicare PPO | Admitting: Physical Therapy

## 2020-01-04 ENCOUNTER — Ambulatory Visit: Payer: Medicare PPO | Admitting: Physical Therapy

## 2020-01-08 ENCOUNTER — Ambulatory Visit: Payer: Medicare PPO | Admitting: Physical Therapy

## 2020-01-11 ENCOUNTER — Ambulatory Visit: Payer: Medicare PPO | Admitting: Physical Therapy

## 2020-01-15 ENCOUNTER — Ambulatory Visit: Payer: Medicare PPO | Admitting: Physical Therapy

## 2020-01-18 ENCOUNTER — Ambulatory Visit: Payer: Medicare PPO | Admitting: Physical Therapy

## 2020-01-22 ENCOUNTER — Ambulatory Visit: Payer: Medicare PPO | Admitting: Physical Therapy

## 2020-01-25 ENCOUNTER — Ambulatory Visit: Payer: Medicare PPO | Admitting: Physical Therapy

## 2020-01-29 ENCOUNTER — Ambulatory Visit: Payer: Medicare PPO | Admitting: Physical Therapy

## 2020-02-01 ENCOUNTER — Ambulatory Visit: Payer: Medicare PPO | Admitting: Physical Therapy

## 2020-02-08 ENCOUNTER — Ambulatory Visit: Payer: Medicare PPO | Admitting: Physical Therapy

## 2020-04-19 ENCOUNTER — Other Ambulatory Visit: Payer: Self-pay

## 2020-04-19 ENCOUNTER — Other Ambulatory Visit
Admission: RE | Admit: 2020-04-19 | Discharge: 2020-04-19 | Disposition: A | Discharge status: Home / Self Care | Payer: Medicare PPO | Source: Ambulatory Visit | Attending: Internal Medicine | Admitting: Internal Medicine

## 2020-04-19 DIAGNOSIS — Z20822 Contact with and (suspected) exposure to covid-19: Secondary | ICD-10-CM | POA: Diagnosis not present

## 2020-04-19 DIAGNOSIS — Z01812 Encounter for preprocedural laboratory examination: Secondary | ICD-10-CM | POA: Diagnosis present

## 2020-04-20 LAB — SARS CORONAVIRUS 2 (TAT 6-24 HRS): SARS Coronavirus 2: NEGATIVE

## 2020-04-23 ENCOUNTER — Encounter: Payer: Self-pay | Admitting: Internal Medicine

## 2020-04-23 ENCOUNTER — Encounter
Admission: RE | Disposition: A | Discharge status: Home / Self Care | Payer: Self-pay | Source: Home / Self Care | Attending: Internal Medicine

## 2020-04-23 ENCOUNTER — Ambulatory Visit
Admission: RE | Admit: 2020-04-23 | Discharge: 2020-04-23 | Disposition: A | Discharge status: Home / Self Care | Payer: Medicare PPO | Attending: Internal Medicine | Admitting: Internal Medicine

## 2020-04-23 ENCOUNTER — Other Ambulatory Visit: Payer: Self-pay

## 2020-04-23 DIAGNOSIS — I420 Dilated cardiomyopathy: Secondary | ICD-10-CM | POA: Insufficient documentation

## 2020-04-23 DIAGNOSIS — Z79899 Other long term (current) drug therapy: Secondary | ICD-10-CM | POA: Diagnosis not present

## 2020-04-23 DIAGNOSIS — I480 Paroxysmal atrial fibrillation: Secondary | ICD-10-CM | POA: Insufficient documentation

## 2020-04-23 DIAGNOSIS — I25708 Atherosclerosis of coronary artery bypass graft(s), unspecified, with other forms of angina pectoris: Secondary | ICD-10-CM | POA: Insufficient documentation

## 2020-04-23 DIAGNOSIS — Z87891 Personal history of nicotine dependence: Secondary | ICD-10-CM | POA: Insufficient documentation

## 2020-04-23 DIAGNOSIS — R739 Hyperglycemia, unspecified: Secondary | ICD-10-CM | POA: Insufficient documentation

## 2020-04-23 DIAGNOSIS — E785 Hyperlipidemia, unspecified: Secondary | ICD-10-CM | POA: Diagnosis not present

## 2020-04-23 DIAGNOSIS — Z7982 Long term (current) use of aspirin: Secondary | ICD-10-CM | POA: Diagnosis not present

## 2020-04-23 DIAGNOSIS — I252 Old myocardial infarction: Secondary | ICD-10-CM | POA: Insufficient documentation

## 2020-04-23 DIAGNOSIS — I200 Unstable angina: Secondary | ICD-10-CM

## 2020-04-23 DIAGNOSIS — K219 Gastro-esophageal reflux disease without esophagitis: Secondary | ICD-10-CM | POA: Diagnosis not present

## 2020-04-23 DIAGNOSIS — I10 Essential (primary) hypertension: Secondary | ICD-10-CM | POA: Diagnosis not present

## 2020-04-23 DIAGNOSIS — Z823 Family history of stroke: Secondary | ICD-10-CM | POA: Diagnosis not present

## 2020-04-23 DIAGNOSIS — Z8249 Family history of ischemic heart disease and other diseases of the circulatory system: Secondary | ICD-10-CM | POA: Insufficient documentation

## 2020-04-23 DIAGNOSIS — I2582 Chronic total occlusion of coronary artery: Secondary | ICD-10-CM | POA: Diagnosis not present

## 2020-04-23 DIAGNOSIS — Z882 Allergy status to sulfonamides status: Secondary | ICD-10-CM | POA: Diagnosis not present

## 2020-04-23 DIAGNOSIS — I739 Peripheral vascular disease, unspecified: Secondary | ICD-10-CM | POA: Diagnosis not present

## 2020-04-23 DIAGNOSIS — I2510 Atherosclerotic heart disease of native coronary artery without angina pectoris: Secondary | ICD-10-CM | POA: Diagnosis present

## 2020-04-23 DIAGNOSIS — E213 Hyperparathyroidism, unspecified: Secondary | ICD-10-CM | POA: Diagnosis not present

## 2020-04-23 DIAGNOSIS — J449 Chronic obstructive pulmonary disease, unspecified: Secondary | ICD-10-CM | POA: Diagnosis not present

## 2020-04-23 DIAGNOSIS — G629 Polyneuropathy, unspecified: Secondary | ICD-10-CM | POA: Diagnosis not present

## 2020-04-23 HISTORY — DX: Pneumonia, unspecified organism: J18.9

## 2020-04-23 HISTORY — DX: Elevated prostate specific antigen (PSA): R97.20

## 2020-04-23 HISTORY — PX: LEFT HEART CATH AND CORS/GRAFTS ANGIOGRAPHY: CATH118250

## 2020-04-23 HISTORY — DX: Cardiac arrhythmia, unspecified: I49.9

## 2020-04-23 HISTORY — DX: Essential (primary) hypertension: I10

## 2020-04-23 HISTORY — DX: Atherosclerotic heart disease of native coronary artery without angina pectoris: I25.10

## 2020-04-23 HISTORY — DX: Polyneuropathy, unspecified: G62.9

## 2020-04-23 HISTORY — DX: Peripheral vascular disease, unspecified: I73.9

## 2020-04-23 HISTORY — DX: Hyperlipidemia, unspecified: E78.5

## 2020-04-23 HISTORY — DX: Hyperglycemia, unspecified: R73.9

## 2020-04-23 HISTORY — DX: Polyp of colon: K63.5

## 2020-04-23 HISTORY — DX: Chronic obstructive pulmonary disease, unspecified: J44.9

## 2020-04-23 HISTORY — DX: Hyperparathyroidism, unspecified: E21.3

## 2020-04-23 HISTORY — DX: Unspecified asthma, uncomplicated: J45.909

## 2020-04-23 SURGERY — LEFT HEART CATH AND CORS/GRAFTS ANGIOGRAPHY
Anesthesia: Moderate Sedation

## 2020-04-23 MED ORDER — LABETALOL HCL 5 MG/ML IV SOLN: 10.0000 mg | INTRAVENOUS | Status: DC | PRN

## 2020-04-23 MED ORDER — HEPARIN (PORCINE) IN NACL 1000-0.9 UT/500ML-% IV SOLN
INTRAVENOUS | Status: DC | PRN
  Administered 2020-04-23: 500 mL

## 2020-04-23 MED ORDER — ONDANSETRON HCL 4 MG/2ML IJ SOLN: 4.0000 mg | Freq: Four times a day (QID) | INTRAMUSCULAR | Status: DC | PRN

## 2020-04-23 MED ORDER — HEPARIN (PORCINE) IN NACL 1000-0.9 UT/500ML-% IV SOLN
INTRAVENOUS | Status: AC
  Filled 2020-04-23: qty 1000

## 2020-04-23 MED ORDER — FENTANYL CITRATE (PF) 100 MCG/2ML IJ SOLN
INTRAMUSCULAR | Status: DC | PRN
  Administered 2020-04-23: 25 ug via INTRAVENOUS

## 2020-04-23 MED ORDER — SODIUM CHLORIDE 0.9% FLUSH: 3.0000 mL | INTRAVENOUS | Status: DC | PRN

## 2020-04-23 MED ORDER — IOHEXOL 300 MG/ML  SOLN
INTRAMUSCULAR | Status: DC | PRN
  Administered 2020-04-23: 155 mL

## 2020-04-23 MED ORDER — HYDRALAZINE HCL 20 MG/ML IJ SOLN: 10.0000 mg | INTRAMUSCULAR | Status: DC | PRN

## 2020-04-23 MED ORDER — ASPIRIN 81 MG PO CHEW
CHEWABLE_TABLET | ORAL | Status: AC
  Filled 2020-04-23: qty 1

## 2020-04-23 MED ORDER — ISOSORBIDE MONONITRATE ER 30 MG PO TB24: 30.0000 mg | ORAL_TABLET | Freq: Every day | ORAL | 11 refills | Status: AC

## 2020-04-23 MED ORDER — MIDAZOLAM HCL 2 MG/2ML IJ SOLN
INTRAMUSCULAR | Status: AC
  Filled 2020-04-23: qty 2

## 2020-04-23 MED ORDER — SODIUM CHLORIDE 0.9 % WEIGHT BASED INFUSION: 272.1000 mL/h | INTRAVENOUS | Status: AC

## 2020-04-23 MED ORDER — ACETAMINOPHEN 325 MG PO TABS: 650.0000 mg | ORAL_TABLET | ORAL | Status: DC | PRN

## 2020-04-23 MED ORDER — MIDAZOLAM HCL 2 MG/2ML IJ SOLN
INTRAMUSCULAR | Status: DC | PRN
  Administered 2020-04-23: 1 mg via INTRAVENOUS

## 2020-04-23 MED ORDER — FENTANYL CITRATE (PF) 100 MCG/2ML IJ SOLN
INTRAMUSCULAR | Status: AC
  Filled 2020-04-23: qty 2

## 2020-04-23 MED ORDER — SODIUM CHLORIDE 0.9 % IV SOLN: 250.0000 mL | INTRAVENOUS | Status: DC | PRN

## 2020-04-23 MED ORDER — ASPIRIN 81 MG PO CHEW
81.0000 mg | CHEWABLE_TABLET | ORAL | Status: AC
  Administered 2020-04-23: 81 mg via ORAL

## 2020-04-23 MED ORDER — LIDOCAINE HCL (PF) 1 % IJ SOLN
INTRAMUSCULAR | Status: AC
  Filled 2020-04-23: qty 30

## 2020-04-23 MED ORDER — SODIUM CHLORIDE 0.9 % WEIGHT BASED INFUSION: 1.0000 mL/kg/h | INTRAVENOUS | Status: DC

## 2020-04-23 MED ORDER — SODIUM CHLORIDE 0.9% FLUSH: 3.0000 mL | Freq: Two times a day (BID) | INTRAVENOUS | Status: DC

## 2020-04-23 MED ORDER — SODIUM CHLORIDE 0.9 % IV SOLN
INTRAVENOUS | Status: AC | PRN
  Administered 2020-04-23: 250 mL/h via INTRAVENOUS

## 2020-04-23 SURGICAL SUPPLY — 10 items
CATH INFINITI 5FR ANG PIGTAIL (CATHETERS) ×3 IMPLANT
CATH INFINITI 5FR JL4 (CATHETERS) ×3 IMPLANT
CATH INFINITI JR4 5F (CATHETERS) ×3 IMPLANT
DEVICE CLOSURE MYNXGRIP 5F (Vascular Products) ×3 IMPLANT
KIT MANI 3VAL PERCEP (MISCELLANEOUS) ×3 IMPLANT
NEEDLE PERC 18GX7CM (NEEDLE) ×3 IMPLANT
PACK CARDIAC CATH (CUSTOM PROCEDURE TRAY) ×3 IMPLANT
SHEATH AVANTI 5FR X 11CM (SHEATH) ×3 IMPLANT
TUBING CIL FLEX 10 FLL-RA (TUBING) ×3 IMPLANT
WIRE GUIDERIGHT .035X150 (WIRE) ×3 IMPLANT

## 2020-04-24 ENCOUNTER — Encounter: Payer: Self-pay | Admitting: Internal Medicine

## 2020-08-16 ENCOUNTER — Ambulatory Visit
Admission: RE | Admit: 2020-08-16 | Discharge: 2020-08-16 | Disposition: A | Discharge status: Home / Self Care | Payer: Medicare PPO | Attending: Internal Medicine | Admitting: Internal Medicine

## 2020-08-16 ENCOUNTER — Ambulatory Visit
Admission: RE | Admit: 2020-08-16 | Discharge: 2020-08-16 | Disposition: A | Discharge status: Home / Self Care | Payer: Medicare PPO | Source: Ambulatory Visit | Attending: Internal Medicine | Admitting: Internal Medicine

## 2020-08-16 ENCOUNTER — Other Ambulatory Visit: Payer: Self-pay | Admitting: Internal Medicine

## 2020-08-16 ENCOUNTER — Other Ambulatory Visit: Payer: Self-pay

## 2020-08-16 DIAGNOSIS — R634 Abnormal weight loss: Secondary | ICD-10-CM

## 2020-08-16 DIAGNOSIS — R059 Cough, unspecified: Secondary | ICD-10-CM | POA: Diagnosis not present

## 2020-08-16 IMAGING — CR DG CHEST 2V
2 series · 2 of 2 positions shown · non-contrast
Comparison: Two-view chest x-ray [DATE]

CLINICAL DATA: Cough.  Weight loss.  Loss of taste for 2 months.

EXAM:
CHEST - 2 VIEW

[chest pa]
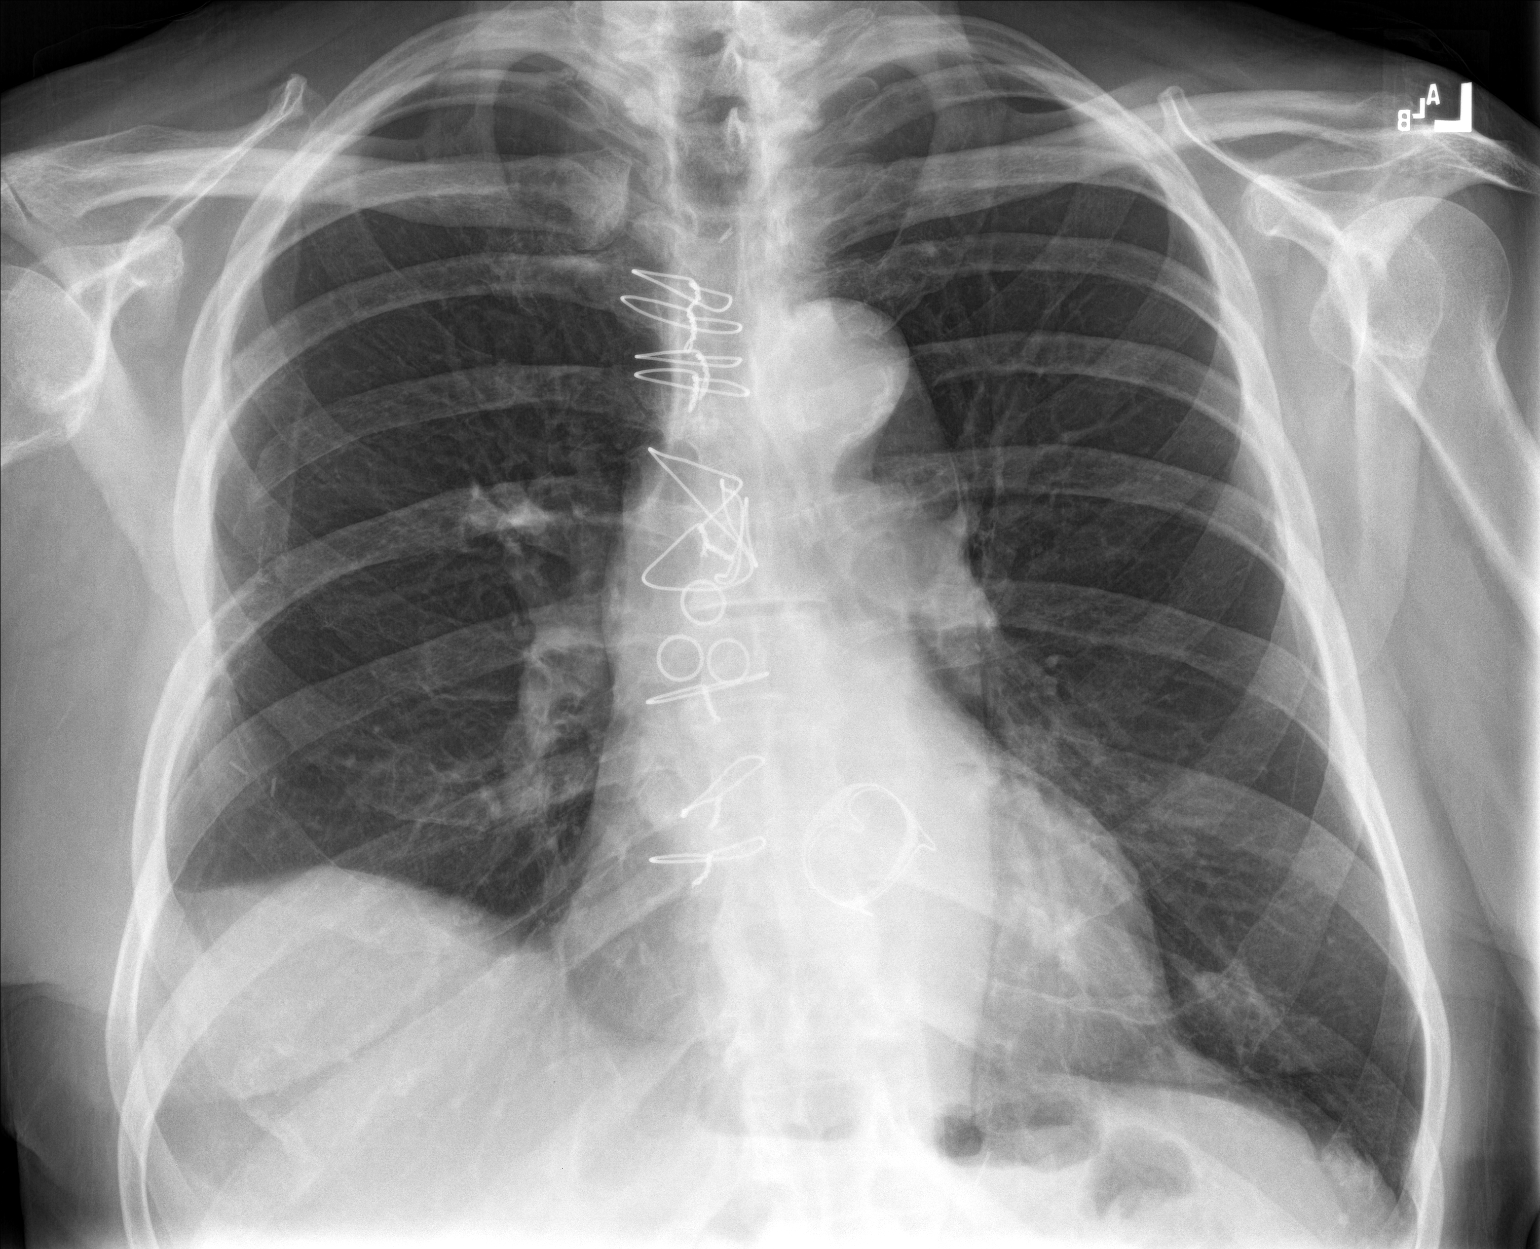

[chest lat]
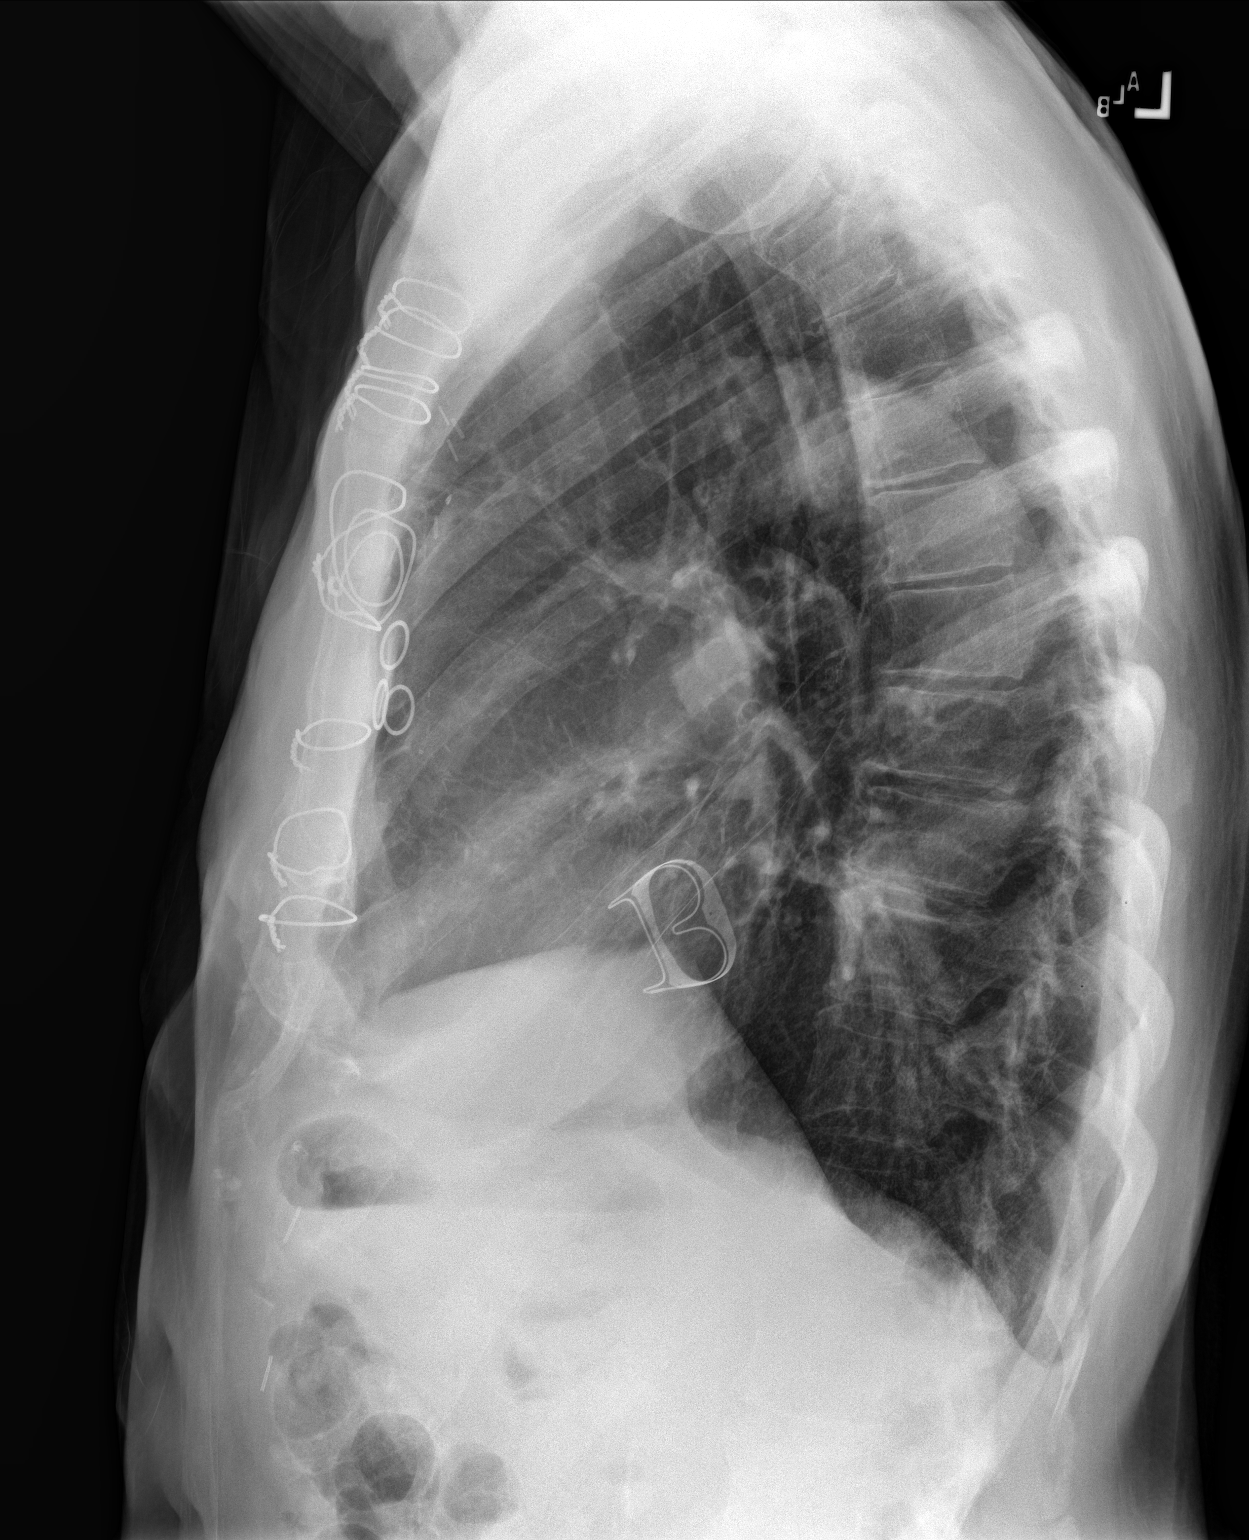

[2 of 2 positions shown; findings below may reference images not displayed]

FINDINGS: Heart size is normal. Patient is status post CABG and aortic valve
replacement. Atherosclerotic changes are again noted at the aortic
arch.

Chronic elevation of the anterior right hemidiaphragm noted. No
edema or effusion is present. No focal airspace disease is present.
IMPRESSION: No acute cardiopulmonary disease or significant interval change.

## 2022-05-08 DEATH — deceased
